# Patient Record
Sex: Female | Born: 1945 | Race: White | Hispanic: No | Marital: Married | Smoking: Former smoker
Health system: Southern US, Community
[De-identification: ages and names within clinical notes are randomized; demographics above are authoritative.]

## PROBLEM LIST (undated history)

## (undated) DIAGNOSIS — G35D Multiple sclerosis, unspecified: Secondary | ICD-10-CM

## (undated) DIAGNOSIS — R7303 Prediabetes: Secondary | ICD-10-CM

## (undated) DIAGNOSIS — G8929 Other chronic pain: Secondary | ICD-10-CM

## (undated) DIAGNOSIS — I10 Essential (primary) hypertension: Secondary | ICD-10-CM

## (undated) DIAGNOSIS — G2581 Restless legs syndrome: Secondary | ICD-10-CM

## (undated) DIAGNOSIS — K219 Gastro-esophageal reflux disease without esophagitis: Secondary | ICD-10-CM

## (undated) DIAGNOSIS — G35 Multiple sclerosis: Secondary | ICD-10-CM

## (undated) HISTORY — PX: EYE SURGERY: SHX253

## (undated) HISTORY — PX: TMJ ARTHROPLASTY: SHX1066

---

## 2015-09-30 DIAGNOSIS — J040 Acute laryngitis: Secondary | ICD-10-CM | POA: Diagnosis not present

## 2015-09-30 DIAGNOSIS — R05 Cough: Secondary | ICD-10-CM | POA: Diagnosis not present

## 2015-09-30 DIAGNOSIS — J40 Bronchitis, not specified as acute or chronic: Secondary | ICD-10-CM | POA: Diagnosis not present

## 2015-09-30 DIAGNOSIS — J209 Acute bronchitis, unspecified: Secondary | ICD-10-CM | POA: Diagnosis not present

## 2017-08-14 DIAGNOSIS — J040 Acute laryngitis: Secondary | ICD-10-CM | POA: Diagnosis not present

## 2017-08-14 DIAGNOSIS — J0100 Acute maxillary sinusitis, unspecified: Secondary | ICD-10-CM | POA: Diagnosis not present

## 2018-03-17 DIAGNOSIS — I87393 Chronic venous hypertension (idiopathic) with other complications of bilateral lower extremity: Secondary | ICD-10-CM | POA: Diagnosis not present

## 2018-03-17 DIAGNOSIS — R600 Localized edema: Secondary | ICD-10-CM | POA: Diagnosis not present

## 2020-09-07 DIAGNOSIS — M47816 Spondylosis without myelopathy or radiculopathy, lumbar region: Secondary | ICD-10-CM | POA: Diagnosis not present

## 2020-09-07 DIAGNOSIS — E785 Hyperlipidemia, unspecified: Secondary | ICD-10-CM | POA: Diagnosis not present

## 2020-09-07 DIAGNOSIS — I249 Acute ischemic heart disease, unspecified: Secondary | ICD-10-CM | POA: Diagnosis not present

## 2020-09-07 DIAGNOSIS — I701 Atherosclerosis of renal artery: Secondary | ICD-10-CM | POA: Diagnosis not present

## 2020-09-07 DIAGNOSIS — I708 Atherosclerosis of other arteries: Secondary | ICD-10-CM | POA: Diagnosis not present

## 2020-09-07 DIAGNOSIS — J439 Emphysema, unspecified: Secondary | ICD-10-CM | POA: Diagnosis not present

## 2020-09-07 DIAGNOSIS — Z87891 Personal history of nicotine dependence: Secondary | ICD-10-CM | POA: Diagnosis not present

## 2020-09-07 DIAGNOSIS — K7689 Other specified diseases of liver: Secondary | ICD-10-CM | POA: Diagnosis not present

## 2020-09-07 DIAGNOSIS — I10 Essential (primary) hypertension: Secondary | ICD-10-CM | POA: Diagnosis not present

## 2020-09-07 DIAGNOSIS — I2510 Atherosclerotic heart disease of native coronary artery without angina pectoris: Secondary | ICD-10-CM | POA: Diagnosis not present

## 2020-09-07 DIAGNOSIS — R0789 Other chest pain: Secondary | ICD-10-CM | POA: Diagnosis not present

## 2020-09-07 DIAGNOSIS — Z7952 Long term (current) use of systemic steroids: Secondary | ICD-10-CM | POA: Diagnosis not present

## 2020-09-07 DIAGNOSIS — R079 Chest pain, unspecified: Secondary | ICD-10-CM | POA: Diagnosis not present

## 2020-09-07 DIAGNOSIS — I214 Non-ST elevation (NSTEMI) myocardial infarction: Secondary | ICD-10-CM | POA: Diagnosis not present

## 2020-09-07 DIAGNOSIS — Z79899 Other long term (current) drug therapy: Secondary | ICD-10-CM | POA: Diagnosis not present

## 2020-09-08 ENCOUNTER — Inpatient Hospital Stay (HOSPITAL_COMMUNITY)
Admission: AD | Admit: 2020-09-08 | Discharge: 2020-09-12 | DRG: 280 | Disposition: A | Discharge status: Home / Self Care | Payer: Medicare Other | Source: Other Acute Inpatient Hospital | Attending: Internal Medicine | Admitting: Internal Medicine

## 2020-09-08 ENCOUNTER — Inpatient Hospital Stay (HOSPITAL_COMMUNITY): Payer: Medicare Other

## 2020-09-08 DIAGNOSIS — R7303 Prediabetes: Secondary | ICD-10-CM | POA: Diagnosis present

## 2020-09-08 DIAGNOSIS — R410 Disorientation, unspecified: Secondary | ICD-10-CM | POA: Diagnosis not present

## 2020-09-08 DIAGNOSIS — I639 Cerebral infarction, unspecified: Secondary | ICD-10-CM | POA: Diagnosis not present

## 2020-09-08 DIAGNOSIS — G35D Multiple sclerosis, unspecified: Secondary | ICD-10-CM | POA: Diagnosis present

## 2020-09-08 DIAGNOSIS — K219 Gastro-esophageal reflux disease without esophagitis: Secondary | ICD-10-CM | POA: Diagnosis not present

## 2020-09-08 DIAGNOSIS — R931 Abnormal findings on diagnostic imaging of heart and coronary circulation: Secondary | ICD-10-CM | POA: Diagnosis not present

## 2020-09-08 DIAGNOSIS — I10 Essential (primary) hypertension: Secondary | ICD-10-CM | POA: Diagnosis not present

## 2020-09-08 DIAGNOSIS — I2510 Atherosclerotic heart disease of native coronary artery without angina pectoris: Secondary | ICD-10-CM | POA: Diagnosis present

## 2020-09-08 DIAGNOSIS — E669 Obesity, unspecified: Secondary | ICD-10-CM | POA: Diagnosis present

## 2020-09-08 DIAGNOSIS — G35 Multiple sclerosis: Secondary | ICD-10-CM | POA: Diagnosis not present

## 2020-09-08 DIAGNOSIS — I708 Atherosclerosis of other arteries: Secondary | ICD-10-CM | POA: Diagnosis not present

## 2020-09-08 DIAGNOSIS — J439 Emphysema, unspecified: Secondary | ICD-10-CM | POA: Diagnosis not present

## 2020-09-08 DIAGNOSIS — I6330 Cerebral infarction due to thrombosis of unspecified cerebral artery: Secondary | ICD-10-CM | POA: Diagnosis not present

## 2020-09-08 DIAGNOSIS — G8929 Other chronic pain: Secondary | ICD-10-CM | POA: Diagnosis present

## 2020-09-08 DIAGNOSIS — D649 Anemia, unspecified: Secondary | ICD-10-CM | POA: Diagnosis present

## 2020-09-08 DIAGNOSIS — G928 Other toxic encephalopathy: Secondary | ICD-10-CM | POA: Diagnosis present

## 2020-09-08 DIAGNOSIS — E785 Hyperlipidemia, unspecified: Secondary | ICD-10-CM | POA: Diagnosis present

## 2020-09-08 DIAGNOSIS — R29705 NIHSS score 5: Secondary | ICD-10-CM | POA: Diagnosis present

## 2020-09-08 DIAGNOSIS — I214 Non-ST elevation (NSTEMI) myocardial infarction: Principal | ICD-10-CM | POA: Diagnosis present

## 2020-09-08 DIAGNOSIS — I701 Atherosclerosis of renal artery: Secondary | ICD-10-CM | POA: Diagnosis not present

## 2020-09-08 DIAGNOSIS — R400 Somnolence: Secondary | ICD-10-CM | POA: Diagnosis not present

## 2020-09-08 DIAGNOSIS — I671 Cerebral aneurysm, nonruptured: Secondary | ICD-10-CM | POA: Diagnosis present

## 2020-09-08 DIAGNOSIS — Z6833 Body mass index (BMI) 33.0-33.9, adult: Secondary | ICD-10-CM

## 2020-09-08 DIAGNOSIS — I249 Acute ischemic heart disease, unspecified: Secondary | ICD-10-CM

## 2020-09-08 DIAGNOSIS — M549 Dorsalgia, unspecified: Secondary | ICD-10-CM | POA: Diagnosis present

## 2020-09-08 DIAGNOSIS — G2581 Restless legs syndrome: Secondary | ICD-10-CM | POA: Diagnosis present

## 2020-09-08 DIAGNOSIS — M47816 Spondylosis without myelopathy or radiculopathy, lumbar region: Secondary | ICD-10-CM | POA: Diagnosis not present

## 2020-09-08 DIAGNOSIS — Z7952 Long term (current) use of systemic steroids: Secondary | ICD-10-CM | POA: Diagnosis not present

## 2020-09-08 DIAGNOSIS — Z79899 Other long term (current) drug therapy: Secondary | ICD-10-CM | POA: Diagnosis not present

## 2020-09-08 DIAGNOSIS — G4710 Hypersomnia, unspecified: Secondary | ICD-10-CM | POA: Diagnosis present

## 2020-09-08 DIAGNOSIS — I63541 Cerebral infarction due to unspecified occlusion or stenosis of right cerebellar artery: Secondary | ICD-10-CM | POA: Diagnosis present

## 2020-09-08 DIAGNOSIS — E119 Type 2 diabetes mellitus without complications: Secondary | ICD-10-CM | POA: Diagnosis present

## 2020-09-08 DIAGNOSIS — Z87891 Personal history of nicotine dependence: Secondary | ICD-10-CM

## 2020-09-08 DIAGNOSIS — K7689 Other specified diseases of liver: Secondary | ICD-10-CM | POA: Diagnosis not present

## 2020-09-08 DIAGNOSIS — I25110 Atherosclerotic heart disease of native coronary artery with unstable angina pectoris: Secondary | ICD-10-CM | POA: Diagnosis not present

## 2020-09-08 DIAGNOSIS — R079 Chest pain, unspecified: Secondary | ICD-10-CM

## 2020-09-08 DIAGNOSIS — I361 Nonrheumatic tricuspid (valve) insufficiency: Secondary | ICD-10-CM

## 2020-09-08 HISTORY — DX: Multiple sclerosis, unspecified: G35.D

## 2020-09-08 HISTORY — DX: Essential (primary) hypertension: I10

## 2020-09-08 HISTORY — DX: Gastro-esophageal reflux disease without esophagitis: K21.9

## 2020-09-08 HISTORY — DX: Other chronic pain: G89.29

## 2020-09-08 HISTORY — DX: Restless legs syndrome: G25.81

## 2020-09-08 HISTORY — DX: Prediabetes: R73.03

## 2020-09-08 HISTORY — DX: Multiple sclerosis: G35

## 2020-09-08 LAB — BRAIN NATRIURETIC PEPTIDE: B Natriuretic Peptide: 43 pg/mL (ref 0.0–100.0)

## 2020-09-08 LAB — BASIC METABOLIC PANEL
Anion gap: 4 — ABNORMAL LOW (ref 5–15)
BUN: 13 mg/dL (ref 8–23)
CO2: 22 mmol/L (ref 22–32)
Calcium: 8.2 mg/dL — ABNORMAL LOW (ref 8.9–10.3)
Chloride: 111 mmol/L (ref 98–111)
Creatinine, Ser: 0.59 mg/dL (ref 0.44–1.00)
GFR, Estimated: >60 mL/min (ref >60)
Glucose, Bld: 138 mg/dL — ABNORMAL HIGH (ref 70–99)
Potassium: 3.9 mmol/L (ref 3.5–5.1)
Sodium: 137 mmol/L (ref 135–145)

## 2020-09-08 LAB — LACTIC ACID, PLASMA: Lactic Acid, Venous: 1.5 mmol/L (ref 0.5–1.9)

## 2020-09-08 LAB — BLOOD GAS, ARTERIAL
Acid-base deficit: 2.4 mmol/L — ABNORMAL HIGH (ref 0.0–2.0)
Bicarbonate: 22.5 mmol/L (ref 20.0–28.0)
Drawn by: 283401
FIO2: 32
O2 Saturation: 94.6 %
Patient temperature: 37
pCO2 arterial: 43.3 mmHg (ref 32.0–48.0)
pH, Arterial: 7.337 — ABNORMAL LOW (ref 7.350–7.450)
pO2, Arterial: 77.9 mmHg — ABNORMAL LOW (ref 83.0–108.0)

## 2020-09-08 LAB — HEPARIN LEVEL (UNFRACTIONATED): Heparin Unfractionated: 0.26 IU/mL — ABNORMAL LOW (ref 0.30–0.70)

## 2020-09-08 LAB — TROPONIN I (HIGH SENSITIVITY): Troponin I (High Sensitivity): 1900 ng/L (ref <18)

## 2020-09-08 MED ORDER — ONDANSETRON HCL 4 MG/2ML IJ SOLN: 4.0000 mg | Freq: Four times a day (QID) | INTRAMUSCULAR | Status: DC | PRN

## 2020-09-08 MED ORDER — ATORVASTATIN CALCIUM 80 MG PO TABS
80.0000 mg | ORAL_TABLET | Freq: Every day | ORAL | Status: DC
  Administered 2020-09-10 – 2020-09-12 (×3): 80 mg via ORAL
  Filled 2020-09-08 (×3): qty 1

## 2020-09-08 MED ORDER — ACETAMINOPHEN 325 MG PO TABS
650.0000 mg | ORAL_TABLET | ORAL | Status: DC | PRN
  Administered 2020-09-09 – 2020-09-12 (×4): 650 mg via ORAL
  Filled 2020-09-08 (×4): qty 2

## 2020-09-08 MED ORDER — NITROGLYCERIN 0.4 MG SL SUBL: 0.4000 mg | SUBLINGUAL_TABLET | SUBLINGUAL | Status: DC | PRN

## 2020-09-08 MED ORDER — NITROGLYCERIN IN D5W 200-5 MCG/ML-% IV SOLN
0.0000 ug/min | INTRAVENOUS | Status: DC
  Administered 2020-09-08: 10 ug/min via INTRAVENOUS
  Filled 2020-09-08: qty 250

## 2020-09-08 MED ORDER — HEPARIN (PORCINE) 25000 UT/250ML-% IV SOLN
1400.0000 [IU]/h | INTRAVENOUS | Status: AC
  Administered 2020-09-08: 900 [IU]/h via INTRAVENOUS
  Administered 2020-09-09 – 2020-09-10 (×2): 1250 [IU]/h via INTRAVENOUS
  Administered 2020-09-11 – 2020-09-12 (×2): 1400 [IU]/h via INTRAVENOUS
  Filled 2020-09-08 (×6): qty 250

## 2020-09-08 MED ORDER — METOPROLOL SUCCINATE ER 25 MG PO TB24
25.0000 mg | ORAL_TABLET | Freq: Every day | ORAL | Status: DC
  Administered 2020-09-09 – 2020-09-11 (×3): 25 mg via ORAL
  Filled 2020-09-08 (×4): qty 1

## 2020-09-08 MED ORDER — ASPIRIN EC 81 MG PO TBEC
81.0000 mg | DELAYED_RELEASE_TABLET | Freq: Every day | ORAL | Status: DC
  Administered 2020-09-10 – 2020-09-12 (×3): 81 mg via ORAL
  Filled 2020-09-08 (×3): qty 1

## 2020-09-08 NOTE — Progress Notes (Signed)
1606 Patient arrived to unit via stretcher with mobile transport.  Patient responds to voice and touch, minially responsive will follow few commands.  Does slur her name and birthday with very delayed response.  Patient denies all pain.  Patient  remains on Nitro drip infusing at and heparin drip at 900units.    [id number] Notified Dr. Shari Prows.  MD will come to bedside to assess.     1630 MD at bedside.  Called former Hospital and spoke with patient's nurse "Corrie Dandy" and inquired about last known normal and any sedating meds she might have received.  Per Nurse patient was declining most of the day.  New orders recevied by MD.  Stat Head CT.

## 2020-09-08 NOTE — Progress Notes (Signed)
ANTICOAGULATION CONSULT NOTE  Pharmacy Consult for heparin Indication: chest pain/ACS  Not on File  Patient Measurements:   Heparin Dosing Weight: 81kg (per OSH records)  Vital Signs: Temp: 97.1 F (36.2 C) (03/12 1606) Temp Source: Axillary (03/12 1606) BP: 149/74 (03/12 1606) Pulse Rate: 72 (03/12 1606)  Labs: No results for input(s): HGB, HCT, PLT, APTT, LABPROT, INR, HEPARINUNFRC, HEPRLOWMOCWT, CREATININE, CKTOTAL, CKMB, TROPONINIHS in the last 72 hours.  CrCl cannot be calculated (No successful lab value found.).   Medical History: No past medical history on file.   Assessment: 33 yoF admitted from OSH, pharmacy asked to manage IV heparin for ACS. No AC PTA noted, CBC wnl at OSH 3/11 pm. Heparin infusing at 900 units/h - started earlier this morning at OSH ~1000 and heparin level is subtherapeutic at 0.26.   Goal of Therapy:  Heparin level 0.3-0.7 units/ml Monitor platelets by anticoagulation protocol: Yes   Plan:  -Increase heparin to 1050 units/h -Recheck heparin level with am labs   Fredonia Highland, PharmD, BCPS, [facility] Clinical Pharmacist [phone] Please check AMION for all [facility] numbers 09/08/2020

## 2020-09-08 NOTE — Progress Notes (Signed)
Checked on patient overnight, more alert than at shift change. Now A&O x3 although drowsy. BG (138), ABG 7.34/43/77. No recent sedating meds. Mental status improving. Significant trop elevation on admission. CP only 1/10 severity on low dose nitro gtt.

## 2020-09-08 NOTE — Progress Notes (Addendum)
Informed Dr. Shari Prows of critical Troponin of 1900.  EKG and CT completed.

## 2020-09-08 NOTE — H&P (Signed)
Cardiology Admission History and Physical:   Patient ID: Caitlin Pierce MRN: [id number]; DOB: Apr 18, 1946   Admission date: 09/08/2020  PCP:  No primary care provider on file.   [facility]  Cardiologist:  No primary care provider on file.  Advanced Practice Provider:  No care team member to display Electrophysiologist:  None     Chief Complaint:  Chest pain  Patient Profile:   Caitlin Pierce is a 75 y.o. female with past medical history of MS, prior tobacco use, and no known cardiac history who initially presented to [facility] with chest pain found to have trop elevation consistent with NSTEMI. [facility] hospital complicated by syncopal episode after using the bathroom prompting more urgent transfer to [facility] hospital.  History of Present Illness:   Caitlin Pierce is a 75 year old female with PMH of MS and no known cardiac history who initially presented to [facility] with chest pain. Specifically, on the night of presentation, the patient was sitting watching TV when she developed severe, squeezing chest pain in the center of her chest with associated SOB. She took aspirin but the pain persisted and therefore EMS was called and she was taken to [facility] for further evaluation. There, ECG without ischemic changes. Trop 0.04-->0.32. CTA negative for PE. Was seen by Cardiology and given presentation and concern for UA, the plan was to transfer to [facility] Monday for cath. Her course there, however, was complicated by an episode of syncope after using the restroom (did not hit her head) prompting more urgent transfer to [facility]. Of note, the patient was alert and oriented throughout her stay at [facility] per report.  On arrival here, the patient is very somnolent and only mildly responsive. Able to follow simple commands and states she is chest pain free with no SOB. Able to squeeze your fingers on command but is mainly sleeping and not responsive. Emergent CT head performed which  was negative. Lactate normal. ECG without acute ischemic changes. Spoke to [employer] who stated she got zofran and phenergan prior to transfer which may have prompted confusion. She remains on heparin gtt and nitro gtt. Trop now 1900. BNP normal at 43. Glucose 138. Cr 0.59 with no significant electrolyte abnormalities.    Medications Prior to Admission: Prior to Admission medications   Medication Sig Start Date End Date Taking? Authorizing Provider  ibuprofen (ADVIL) 200 MG tablet Take 600 mg by mouth every 6 (six) hours as needed for headache (pain).   Yes [provider]  rOPINIRole (REQUIP) 1 MG tablet Take 1-4 mg by mouth See admin instructions. Take one tablet (1 mg) by mouth twice daily as needed for restless legs, take four tablets (4 mg) at bedtime 04/23/20  Yes [provider]     Allergies:   No Known Allergies  Social History:   Social History   Socioeconomic History  . Marital status: Married    Spouse name: Not on file  . Number of children: Not on file  . Years of education: Not on file  . Highest education level: Not on file  Occupational History  . Not on file  Tobacco Use  . Smoking status: Not on file  . Smokeless tobacco: Not on file  Substance and Sexual Activity  . Alcohol use: Not on file  . Drug use: Not on file  . Sexual activity: Not on file  Other Topics Concern  . Not on file  Social History Narrative  .  Not on file   Social Determinants of Health   Financial Resource Strain: Not on file  Food Insecurity: Not on file  Transportation Needs: Not on file  Physical Activity: Not on file  Stress: Not on file  Social Connections: Not on file  Intimate Partner Violence: Not on file    Family History:   The patient's family history is not on file.    ROS:  Denies chest pain or SOB. Otherwise unable to obtain due to somnolence  Physical Exam/Data:   Vitals:   09/08/20 1751 09/08/20 1806 09/08/20 1821 09/08/20  1836  BP: (!) 142/53 124/65 (!) 103/50 (!) 117/55  Pulse: 77 70 71   Resp: 16 20 20    Temp:      TempSrc:      SpO2: 93% 95% 98%    No intake or output data in the 24 hours ending 09/08/20 1848 No flowsheet data found.   There is no height or weight on file to calculate BMI.  General:  Very somnolent on exam; minimally responsive but able to state her name and follow simple commands HEENT: normal Neck: no JVD Vascular: No carotid bruits; FA pulses 2+ bilaterally without bruits  Cardiac:  normal S1, S2; RRR; no murmur  Lungs:  clear to auscultation bilaterally, no wheezing, rhonchi or rales  Abd: soft, nontender, no hepatomegaly  Ext: no edema Musculoskeletal:  No deformities, BUE and BLE strength normal and equal Skin: warm and dry  Neuro:  Minimally responsive, able to follow simple commands when asked Psych:  Unable to assess   EKG:  The ECG that was done  was personally reviewed and demonstrates NSR, poor r-wave progression (similar to [facility] ECG)  Relevant CV Studies: Pending  Laboratory Data:  High Sensitivity Troponin:   Recent Labs  Lab 09/08/20 1706  TROPONINIHS 1,900*      Chemistry Recent Labs  Lab 09/08/20 1706  NA 137  K 3.9  CL 111  CO2 22  GLUCOSE 138*  BUN 13  CREATININE 0.59  CALCIUM 8.2*  GFRNONAA >60  ANIONGAP 4*    No results for input(s): PROT, ALBUMIN, AST, ALT, ALKPHOS, BILITOT in the last 168 hours. HematologyNo results for input(s): WBC, RBC, HGB, HCT, MCV, MCH, MCHC, RDW, PLT in the last 168 hours. BNP Recent Labs  Lab 09/08/20 1706  BNP 43.0    DDimer No results for input(s): DDIMER in the last 168 hours.   Radiology/Studies:  CT HEAD WO CONTRAST  Result Date: 09/08/2020 CLINICAL DATA:  Confusion EXAM: CT HEAD WITHOUT CONTRAST TECHNIQUE: Contiguous axial images were obtained from the base of the skull through the vertex without intravenous contrast. COMPARISON:  None. FINDINGS: Brain: No evidence of acute infarction,  hemorrhage, hydrocephalus, extra-axial collection or mass lesion/mass effect. Mild-moderate low-density changes within the periventricular and subcortical white matter compatible with chronic microvascular ischemic change. Mild diffuse cerebral volume loss. Vascular: Atherosclerotic calcifications involving the large vessels of the skull base. No unexpected hyperdense vessel. Skull: Normal. Negative for fracture or focal lesion. Sinuses/Orbits: No acute finding. Other: None. IMPRESSION: 1. No acute intracranial findings. 2. 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 Electronically Signed   By: 11/08/2020 D.O.   On: 09/08/2020 18:00     Assessment and Plan:   #NSTEMI: Patient with no known cardiac history presenting with severe substernal chest pressure that began at rest with troponin rise consistent with NSTEMI. History limited as patient currently minimally responsive, however, she denies any current chest  pain or SOB. ECG with poor r-wave progression but no acute ischemic changes. Trop on arrival 1900. BNP normal. TTE pending. CT head negative for acute pathology. -Continue heparin gtt -Continue ASA 81mg  daily and atorvastatin 80mg  once more awake and able to take medications -Start metop 25mg  XL daily once able to tolerate PO -ACE/ARB after cath -Trend trop -TTE pending -Continue nitro gtt for chest pain -Plan for cath Monday pending clinically stability. Will need to work-up AMS more extensively  #AMS: Unclear etiology. Possibly medication effect with zofran and phenergan received at OSH. No opioids given. Also with MS. CT head negative for acute pathology. Not febrile. Lactate normal. Glucose normal. Electrolytes wnl. Currently chest pain free with no ECG changes. -No sedating medications overnight -CT head negative -Check blood and urine cultures -Lactate normal -Manage NSTEMI as above -Neuro consult in AM  #Syncope: Possibly vagal in the setting of using  the restroom. No reported arrhythmias or hypotension. Fortunately did not fall or hit her head but made it to the bed. Story limited as patient altered and husband was not present during the event. Now altered as above. CT head negative. HD stable. -Manage NSTEMI and AMS as above -Telemetry -Once patient more alert, will obtain history regarding syncope  #MS: -Given AMS, may consider neuro consult in AM    Risk Assessment/Risk Scores:     TIMI Risk Score for Unstable Angina or Non-ST Elevation MI:   The patient's TIMI risk score is 4, which indicates a 20% risk of all cause mortality, new or recurrent myocardial infarction or need for urgent revascularization in the next 14 days.{   Severity of Illness: The appropriate patient status for this patient is INPATIENT. Inpatient status is judged to be reasonable and necessary in order to provide the required intensity of service to ensure the patient's safety. The patient's presenting symptoms, physical exam findings, and initial radiographic and laboratory data in the context of their chronic comorbidities is felt to place them at high risk for further clinical deterioration. Furthermore, it is not anticipated that the patient will be medically stable for discharge from the hospital within 2 midnights of admission. The following factors support the patient status of inpatient.   " The patient's presenting symptoms include NSTEMI, AMS. " The worrisome physical exam findings include Trop elevation. " The initial radiographic and laboratory data are worrisome because of Trop 1900. " The chronic co-morbidities include MS.   * I certify that at the point of admission it is my clinical judgment that the patient will require inpatient hospital care spanning beyond 2 midnights from the point of admission due to high intensity of service, high risk for further deterioration and high frequency of surveillance required.*    For questions or updates,  please contact [facility] Please consult [email or web address] for contact info under     Signed, , MD  09/08/2020 6:48 PM

## 2020-09-08 NOTE — Progress Notes (Signed)
Patient back to room from CT, RE: stat head CT.

## 2020-09-09 ENCOUNTER — Encounter (HOSPITAL_COMMUNITY): Payer: Self-pay | Admitting: Cardiology

## 2020-09-09 ENCOUNTER — Inpatient Hospital Stay (HOSPITAL_COMMUNITY): Payer: Medicare Other

## 2020-09-09 DIAGNOSIS — I214 Non-ST elevation (NSTEMI) myocardial infarction: Principal | ICD-10-CM

## 2020-09-09 DIAGNOSIS — R400 Somnolence: Secondary | ICD-10-CM

## 2020-09-09 DIAGNOSIS — K219 Gastro-esophageal reflux disease without esophagitis: Secondary | ICD-10-CM | POA: Diagnosis present

## 2020-09-09 DIAGNOSIS — R7303 Prediabetes: Secondary | ICD-10-CM | POA: Diagnosis present

## 2020-09-09 DIAGNOSIS — G35 Multiple sclerosis: Secondary | ICD-10-CM | POA: Diagnosis present

## 2020-09-09 DIAGNOSIS — M549 Dorsalgia, unspecified: Secondary | ICD-10-CM | POA: Diagnosis present

## 2020-09-09 DIAGNOSIS — G2581 Restless legs syndrome: Secondary | ICD-10-CM | POA: Diagnosis present

## 2020-09-09 DIAGNOSIS — G8929 Other chronic pain: Secondary | ICD-10-CM | POA: Diagnosis present

## 2020-09-09 DIAGNOSIS — I10 Essential (primary) hypertension: Secondary | ICD-10-CM | POA: Diagnosis present

## 2020-09-09 LAB — BASIC METABOLIC PANEL
Anion gap: 5 (ref 5–15)
BUN: 10 mg/dL (ref 8–23)
CO2: 24 mmol/L (ref 22–32)
Calcium: 8.3 mg/dL — ABNORMAL LOW (ref 8.9–10.3)
Chloride: 108 mmol/L (ref 98–111)
Creatinine, Ser: 0.51 mg/dL (ref 0.44–1.00)
GFR, Estimated: >60 mL/min (ref >60)
Glucose, Bld: 123 mg/dL — ABNORMAL HIGH (ref 70–99)
Potassium: 3.8 mmol/L (ref 3.5–5.1)
Sodium: 137 mmol/L (ref 135–145)

## 2020-09-09 LAB — ECHOCARDIOGRAM COMPLETE
AR max vel: 3.11 cm2
AV Area VTI: 3.05 cm2
AV Area mean vel: 3.18 cm2
AV Mean grad: 4 mmHg
AV Peak grad: 8.6 mmHg
Ao pk vel: 1.47 m/s
Area-P 1/2: 4.71 cm2
Calc EF: 64.4 %
Height: 64 in
S' Lateral: 2.9 cm
Single Plane A2C EF: 61.6 %
Single Plane A4C EF: 69 %
Weight: 3153.46 oz

## 2020-09-09 LAB — CBC
HCT: 30.2 % — ABNORMAL LOW (ref 36.0–46.0)
Hemoglobin: 10.5 g/dL — ABNORMAL LOW (ref 12.0–15.0)
MCH: 33.8 pg (ref 26.0–34.0)
MCHC: 34.8 g/dL (ref 30.0–36.0)
MCV: 97.1 fL (ref 80.0–100.0)
Platelets: 214 10*3/uL (ref 150–400)
RBC: 3.11 MIL/uL — ABNORMAL LOW (ref 3.87–5.11)
RDW: 18.6 % — ABNORMAL HIGH (ref 11.5–15.5)
WBC: 8 10*3/uL (ref 4.0–10.5)
nRBC: 0.3 % — ABNORMAL HIGH (ref 0.0–0.2)

## 2020-09-09 LAB — HEPARIN LEVEL (UNFRACTIONATED)
Heparin Unfractionated: 0.23 IU/mL — ABNORMAL LOW (ref 0.30–0.70)
Heparin Unfractionated: 0.41 IU/mL (ref 0.30–0.70)

## 2020-09-09 LAB — TROPONIN I (HIGH SENSITIVITY): Troponin I (High Sensitivity): 609 ng/L (ref <18)

## 2020-09-09 LAB — MAGNESIUM: Magnesium: 1.8 mg/dL (ref 1.7–2.4)

## 2020-09-09 NOTE — Progress Notes (Addendum)
Progress Note  Patient Name: Caitlin Pierce Date of Encounter: 09/09/2020  [facility] Cardiologist: No primary care provider on file.   Subjective   More awake but remains somnolent and falls asleep on examination. Denies chest pain or SOB.  ABG overnight 7.34/43/77 CT head negative for acute pathology Infectious work-up sent Trop 1900 TTE pending  Inpatient Medications    Scheduled Meds: . aspirin EC  81 mg Oral Daily  . atorvastatin  80 mg Oral Daily  . metoprolol succinate  25 mg Oral Daily   Continuous Infusions: . heparin 1,250 Units/hr (09/09/20 0750)  . nitroGLYCERIN 15 mcg/min (09/09/20 0740)   PRN Meds: acetaminophen, ondansetron (ZOFRAN) IV   Vital Signs    Vitals:   09/09/20 0507 09/09/20 0551 09/09/20 0847 09/09/20 0939  BP: (!) 133/50 (!) 139/52 (!) 131/55 136/62  Pulse:  76 63 65  Resp: 20  20   Temp: 98 F (36.7 C)  97.8 F (36.6 C)   TempSrc: Axillary  Oral   SpO2:  98% 97%   Weight: 89.4 kg     Height:        Intake/Output Summary (Last 24 hours) at 09/09/2020 1212 Last data filed at 09/09/2020 0600 Gross per 24 hour  Intake 140.42 ml  Output 350 ml  Net -209.58 ml   Last 3 Weights 09/09/2020 09/08/2020  Weight (lbs) 197 lb 1.5 oz 197 lb 1.5 oz  Weight (kg) 89.4 kg 89.4 kg      Telemetry    NSR - Personally Reviewed  ECG    NSR, non-specific ST-T wave changes - Personally Reviewed  Physical Exam   GEN: Somnolent but arousable. Answers questions appropriately but then falls back asleep Neck: No JVD Cardiac: RRR, no murmurs, rubs, or gallops.  Respiratory: Clear to auscultation bilaterally. GI: Soft, nontender, non-distended  MS: No edema; No deformity. Neuro:  Very somnolent but AAOx3, moves all extremities Psych: Somnolent  Labs    High Sensitivity Troponin:   Recent Labs  Lab 09/08/20 1706  TROPONINIHS 1,900*      Chemistry Recent Labs  Lab 09/08/20 1706 09/09/20 0451  NA 137 137  K 3.9 3.8  CL 111 108   CO2 22 24  GLUCOSE 138* 123*  BUN 13 10  CREATININE 0.59 0.51  CALCIUM 8.2* 8.3*  GFRNONAA >60 >60  ANIONGAP 4* 5     Hematology Recent Labs  Lab 09/09/20 0451  WBC 8.0  RBC 3.11*  HGB 10.5*  HCT 30.2*  MCV 97.1  MCH 33.8  MCHC 34.8  RDW 18.6*  PLT 214    BNP Recent Labs  Lab 09/08/20 1706  BNP 43.0     DDimer No results for input(s): DDIMER in the last 168 hours.   Radiology    CT HEAD WO CONTRAST  Result Date: 09/08/2020 CLINICAL DATA:  Confusion EXAM: CT HEAD WITHOUT CONTRAST TECHNIQUE: Contiguous axial images were obtained from the base of the skull through the vertex without intravenous contrast. COMPARISON:  None. FINDINGS: Brain: No evidence of acute infarction, hemorrhage, hydrocephalus, extra-axial collection or mass lesion/mass effect. Mild-moderate low-density changes within the periventricular and subcortical white matter compatible with chronic microvascular ischemic change. Mild diffuse cerebral volume loss. Vascular: Atherosclerotic calcifications involving the large vessels of the skull base. No unexpected hyperdense vessel. Skull: Normal. Negative for fracture or focal lesion. Sinuses/Orbits: No acute finding. Other: None. IMPRESSION: 1. No acute intracranial findings. 2. 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 Electronically Signed   By:  Nicholas  Plundo D.O.   On: 09/08/2020 18:00    Cardiac Studies   TTE pending  Patient Profile     75 y.o. female with past medical history of MS, prior tobacco use, and no known cardiac history who initially presented to [facility] with chest pain found to have trop elevation consistent with NSTEMI. [facility] hospital complicated by syncopal episode after using the bathroom prompting more urgent transfer to [facility] hospital. Course her complicated by AMS/significant somnolence likely due to medication effect now improving.  Assessment & Plan    #NSTEMI: Patient with no known cardiac  history presenting with severe substernal chest pressure that began at rest with troponin rise consistent with NSTEMI. History limited as patient currently remains very somnlent, however, she denies any current chest pain or SOB. ECG with poor r-wave progression but no acute ischemic changes. Trop on arrival 1900; repeat pending. BNP normal. TTE pending. CT head negative for acute pathology. -Continue heparin gtt -Continue ASA 81mg  daily and atorvastatin 80mg   -Continue metop 25mg  XL daily  -ACE/ARB after cath -Trend trop -TTE pending -Continue nitro gtt for chest pain -Possible cath Monday, however, would want mental status to improve back to baseline prior to procedure  #AMS: Patient very somnolent and minimally arousable on arrival. Was able to answer brief "yes" and "no" questions and follow simple commands prior to falling back asleep. Improved this morning, however, remains very sedated. CT head negative. ABG wnl. Glucose normal. ECG with no acute ischemic changes. Lactate normal. Infectious work-up pending. Patient is HD stable. -Consult triad hospitalists -Hold sedating medications -CT head negative, ABG wnl -Follow-up blood and urine cultures -Ideally would like to proceed with coronary angiography tomorrow; but should be back to baseline mental status before proceeding  #Syncope: Possibly vagal in the setting of using the restroom. No reported arrhythmias or hypotension. Fortunately did not fall or hit her head but made it to the bed. CT head negative. HD stable. -Manage NSTEMI and AMS as above -Telemetry  #MS: -Given persistent AMS, will consult triad hospitalist   For questions or updates, please contact [facility] Please consult [email or web address] for contact info under        Signed, , MD  09/09/2020, 12:12 PM

## 2020-09-09 NOTE — Progress Notes (Signed)
Pt completely awake alert and oriented this pm. Emelda Brothers RN

## 2020-09-09 NOTE — Consult Note (Signed)
Medical Consultation   Caitlin Pierce  TOI:[id number]  DOB: [birth year]  DOA: 09/08/2020  [facility]  Outpatient Specialists: Beverely Pace - neurosurgery   NOK: Shonda, [place], [phone]  Requesting physician: Shari Prows - cardiology  Reason for consultation: H/o MS but walks with a cane without cognitive impairment at baseline.  Went to [facility] for CP.  Had (near?) syncope there.  Alert and oriented at [facility] but arrived here obtunded.  Meds not suspected as cause.  Head CT and labs unremarkable.  Still very somnolent today.  Needs cath tomorrow but too sedated.   History of Present Illness: Caitlin Pierce is an 75 y.o. female with h/o MS; GERD; HTN; chronic back pain; prediabetes; and RLS who presented on 3/12 to OSH with chest pain.  While at OSH, she was found to have elevated troponin and was transferred to [facility] for cardiac catheterization.  However, she also suffered a near syncope vs. Syncopal event.  She did not appear to have head trauma and was reportedly conversant and appropriate prior to transfer.  Upon arrival at [facility], she was minimally responsive.  Charting indicates that the "patient was declining most of the day" at the OSH.  Overnight she was more alert but A&O x 3 according to notes.  At the time of my evaluation, the patient was exceedingly somnolent.  She was oriented to person and knew that she was in the hospital in [place] but was not able to stay awake long enough to answer further questions.  She has not been prescribed any controlled medications according to the PDMP.  I called and spoke with her husband.  He reports that Friday night about 930pm she started feeling light-headed.  He went to check on her and she was gasping for breath and holding her L chest and shoulder.  EMS came and placed her on a monitor and took her to [facility].  RH decided to keep her overnight.  Yesterday AM (3/12), cardiology did successive EKGs and he reported initial EKG was normal;  2nd was slightly abnormal; and 3rd was abnormal.  They decided to transfer here for cath on Monday.  She went to the bathroom and came back and passed out in the bed - and so they expedited transfer.  She is not sleepy at baseline.  "They doped her up for transport" - she was out of it because of this reason.  This AM, she was able to provide history remembering going to the bathroom but nothing else.  He spoke with her this AM and she was still groggy but much better than prior.  She was still light-headed.  She was diagnosed with MS years ago.  Early in their 48-year marriage, she had headaches and issues and went to a number of doctors with lots of inconclusive diagnoses.  She went to [facility] and was diagnosed with severe TMJ; this was repaired and her headaches resolved.  She had good vision until about 6 months after getting out of [employer].  This got so bad that one eye couldn't be corrected with glasses.  She started having difficulty walking with pain in her hip and trouble with her balance.  She had an MRI of her head and diagnosed MS - probably in the late 90s.  She tried every MS medication at first but they made her sicker than the MS did and so she stopped taking them.  Her last major  episode was in the early 2000s and she started using a quad cane (which she has had ever since).  Her episode made her unable to walk, "like someone hit her in the back with a cattle prod."  She has had some minor episodes since then but none major since.  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   Review of Systems:  ROS Unable to perform due to patient's somnolence   Past Medical History: Past Medical History:  Diagnosis Date  . Chronic back pain   . Essential hypertension   . GERD (gastroesophageal reflux disease)   . MS (multiple sclerosis) (HCC)   . Prediabetes   . RLS (restless legs syndrome)     Past Surgical  History: History reviewed. No pertinent surgical history.   Allergies:  No Known Allergies   Social History:  reports that she has quit smoking. She has never used smokeless tobacco. No history on file for alcohol use and drug use.   Family History: History reviewed. No pertinent family history.    Physical Exam: Vitals:   09/09/20 0551 09/09/20 0847 09/09/20 0939 09/09/20 1245  BP: (!) 139/52 (!) 131/55 136/62 (!) 119/55  Pulse: 76 63 65 73  Resp:  20  16  Temp:  97.8 F (36.6 C)  98.8 F (37.1 C)  TempSrc:  Oral  Oral  SpO2: 98% 97%  98%  Weight:      Height:        Constitutional: very somnolent, oriented x 1, immediately falls asleep after every question Eyes: EOMI, irises appear normal, anicteric sclera,  ENMT: external ears and nose appear normal, normal hearing, Lips appear normal Neck: neck appears normal, no masses CVS: S1-S2 clear, no murmur rubs or gallops, no LE edema, normal pedal pulses  Respiratory:  clear to auscultation bilaterally, no wheezing, rales or rhonchi. Respiratory effort normal. No accessory muscle use.  Abdomen: soft nontender, nondistended Musculoskeletal: : no cyanosis, clubbing or edema noted bilaterally Neuro: too somnolent to assess Psych: very somnolent Skin: no rashes or lesions or ulcers, no induration or nodules    Data reviewed:  I have personally reviewed the recent labs and imaging studies  Pertinent Labs:   Glucose 123 WBC 8.0 Hgb 10.5   Inpatient Medications:   Scheduled Meds: . aspirin EC  81 mg Oral Daily  . atorvastatin  80 mg Oral Daily  . metoprolol succinate  25 mg Oral Daily   Continuous Infusions: . heparin 1,250 Units/hr (09/09/20 0750)  . nitroGLYCERIN 15 mcg/min (09/09/20 0740)     Radiological Exams on Admission: CT HEAD WO CONTRAST  Result Date: 09/08/2020 CLINICAL DATA:  Confusion EXAM: CT HEAD WITHOUT CONTRAST TECHNIQUE: Contiguous axial images were obtained from the base of the skull  through the vertex without intravenous contrast. COMPARISON:  None. FINDINGS: Brain: No evidence of acute infarction, hemorrhage, hydrocephalus, extra-axial collection or mass lesion/mass effect. Mild-moderate low-density changes within the periventricular and subcortical white matter compatible with chronic microvascular ischemic change. Mild diffuse cerebral volume loss. Vascular: Atherosclerotic calcifications involving the large vessels of the skull base. No unexpected hyperdense vessel. Skull: Normal. Negative for fracture or focal lesion. Sinuses/Orbits: No acute finding. Other: None. IMPRESSION: 1. No acute intracranial findings. 2. 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 Electronically Signed   By: Duanne Guess D.O.   On: 09/08/2020 18:00   ECHOCARDIOGRAM COMPLETE  Result Date: 09/09/2020    ECHOCARDIOGRAM  REPORT   Patient Name:   KATHYE CIPRIANI Date of Exam: 09/09/2020 Medical Rec #:  [id number]   Height:       64.0 in Accession #:    [id number]  Weight:       197.1 lb Date of Birth:  11/27/1945   BSA:          1.944 m Patient Age:    74 years    BP:           111/56 mmHg Patient Gender: F           HR:           71 bpm. Exam Location:  Inpatient Procedure: 2D Echo, Cardiac Doppler and Color Doppler Indications:    NSTEMI  History:        Patient has no prior history of Echocardiogram examinations.                 Acute MI.  Sonographer:    Neomia Dear RDCS Referring Phys: [id number] HEATHER E [place] IMPRESSIONS  1. Left ventricular ejection fraction, by estimation, is 65 to 70%. The left ventricle has normal function. The left ventricle has no regional wall motion abnormalities. Left ventricular diastolic parameters are consistent with Grade I diastolic dysfunction (impaired relaxation).  2. Right ventricular systolic function is normal. The right ventricular size is normal. There is normal pulmonary artery systolic pressure.  3. Left atrial size was mildly dilated.  4. The  mitral valve is normal in structure. No evidence of mitral valve regurgitation. No evidence of mitral stenosis.  5. The aortic valve is tricuspid. Aortic valve regurgitation is not visualized. No aortic stenosis is present.  6. The inferior vena cava is normal in size with greater than 50% respiratory variability, suggesting right atrial pressure of 3 mmHg. FINDINGS  Left Ventricle: Left ventricular ejection fraction, by estimation, is 65 to 70%. The left ventricle has normal function. The left ventricle has no regional wall motion abnormalities. The left ventricular internal cavity size was normal in size. There is  no left ventricular hypertrophy. Left ventricular diastolic parameters are consistent with Grade I diastolic dysfunction (impaired relaxation). Indeterminate filling pressures. Right Ventricle: The right ventricular size is normal. No increase in right ventricular wall thickness. Right ventricular systolic function is normal. There is normal pulmonary artery systolic pressure. The tricuspid regurgitant velocity is 2.43 m/s, and  with an assumed right atrial pressure of 3 mmHg, the estimated right ventricular systolic pressure is 26.6 mmHg. Left Atrium: Left atrial size was mildly dilated. Right Atrium: Right atrial size was normal in size. Pericardium: Trivial pericardial effusion is present. Mitral Valve: The mitral valve is normal in structure. No evidence of mitral valve regurgitation. No evidence of mitral valve stenosis. Tricuspid Valve: The tricuspid valve is normal in structure. Tricuspid valve regurgitation is mild . No evidence of tricuspid stenosis. Aortic Valve: The aortic valve is tricuspid. Aortic valve regurgitation is not visualized. No aortic stenosis is present. Aortic valve mean gradient measures 4.0 mmHg. Aortic valve peak gradient measures 8.6 mmHg. Aortic valve area, by VTI measures 3.05 cm. Pulmonic Valve: The pulmonic valve was normal in structure. Pulmonic valve regurgitation is  trivial. No evidence of pulmonic stenosis. Aorta: The aortic root is normal in size and structure. Venous: The inferior vena cava is normal in size with greater than 50% respiratory variability, suggesting right atrial pressure of 3 mmHg. IAS/Shunts: No atrial level shunt detected by color flow Doppler.  LEFT VENTRICLE PLAX 2D LVIDd:  4.60 cm     Diastology LVIDs:         2.90 cm     LV e' medial:    6.42 cm/s LV PW:         1.10 cm     LV E/e' medial:  12.3 LV IVS:        0.95 cm     LV e' lateral:   10.70 cm/s LVOT diam:     2.30 cm     LV E/e' lateral: 7.4 LV SV:         91 LV SV Index:   47 LVOT Area:     4.15 cm  LV Volumes (MOD) LV vol d, MOD A2C: 78.3 ml LV vol d, MOD A4C: 71.5 ml LV vol s, MOD A2C: 30.1 ml LV vol s, MOD A4C: 22.2 ml LV SV MOD A2C:     48.2 ml LV SV MOD A4C:     71.5 ml LV SV MOD BP:      48.5 ml LEFT ATRIUM             Index       RIGHT ATRIUM           Index LA diam:        3.70 cm 1.90 cm/m  RA Area:     16.80 cm LA Vol (A2C):   79.6 ml 40.94 ml/m RA Volume:   44.10 ml  22.68 ml/m LA Vol (A4C):   72.0 ml 37.03 ml/m LA Biplane Vol: 80.1 ml 41.20 ml/m  AORTIC VALVE                   PULMONIC VALVE AV Area (Vmax):    3.11 cm    PV Vmax:       1.09 m/s AV Area (Vmean):   3.18 cm    PV Vmean:      73.300 cm/s AV Area (VTI):     3.05 cm    PV VTI:        0.228 m AV Vmax:           147.00 cm/s PV Peak grad:  4.8 mmHg AV Vmean:          93.700 cm/s PV Mean grad:  3.0 mmHg AV VTI:            0.297 m AV Peak Grad:      8.6 mmHg AV Mean Grad:      4.0 mmHg LVOT Vmax:         110.00 cm/s LVOT Vmean:        71.800 cm/s LVOT VTI:          0.218 m LVOT/AV VTI ratio: 0.73  AORTA Ao Root diam: 3.30 cm Ao Asc diam:  3.10 cm MITRAL VALVE               TRICUSPID VALVE MV Area (PHT): 4.71 cm    TR Peak grad:   23.6 mmHg MV Decel Time: 161 msec    TR Vmax:        243.00 cm/s MV E velocity: 78.80 cm/s MV A velocity: 95.50 cm/s  SHUNTS MV E/A ratio:  0.83        Systemic VTI:  0.22 m                             Systemic Diam: 2.30 cm Chilton Siiffany [place] MD Electronically signed by Elmarie Shileyiffany  Duke Salvia MD Signature Date/Time: 09/09/2020/12:44:47 PM    Final     Impression/Recommendations Active Problems:   NSTEMI (non-ST elevated myocardial infarction) (HCC)   MS (multiple sclerosis) (HCC)   GERD (gastroesophageal reflux disease)   Essential hypertension   Chronic back pain   Prediabetes   RLS (restless legs syndrome)  NSTEMI -Patient presented to [facility] with CP -Elevated troponin, c/w NSTEMI -Transferred to [facility] for cardiac cath, which is planned for tomorrow -Management per cardiology  Acute metabolic encephalopthy -History is not entirely clear but she reportedly had a syncopal episode while at [facility] -Her husband reports excessive sedation prior to transport - although the nurse at [facility] denied this -She appears to have hypersomnolence -This could be related to medications with just very slow metabolism of the drugs -However, she does have a h/o untreated MS and also a reported brain aneurysm and so MRI needs to be performed - this has been requested -Evaluation thus far unremarkable including head CT -According to her nurse, her mental status is completely clear now - so perhaps it was associated with the medications she received prior to transport yesterday  MS -Her husband reports that she had side effects will "all" of the MS drugs after her diagnosis in the last 90s and so she has not been on medications -He does remember her having an MRI at some point with a finding of an aneurysm but he is not sure when that was -Will check MRI -If abnormal, may need neuro vs. Neurosurgical consultation  HTN -She does not appear to be taking medications for this issue at this time  Pre-DM -Will check A1c tomorrow AM  Chronic back pain/RLS -She only takes Advil and Requip at home -Both are on hold for now    Thank you for this consultation.  Our [facility] hospitalist team will follow the patient  with you.   Time Spent: 55 minutes  Jonah Blue M.D. [facility] 09/09/2020, 2:03 PM

## 2020-09-09 NOTE — Progress Notes (Addendum)
ANTICOAGULATION CONSULT NOTE  Pharmacy Consult for heparin Indication: chest pain/ACS  No Known Allergies  Patient Measurements: Height: 5\' 4"  (162.6 cm) Weight: 89.4 kg (197 lb 1.5 oz) IBW/kg (Calculated) : 54.7 Heparin Dosing Weight: 81kg (per OSH records)  Vital Signs: Temp: 98 F (36.7 C) (03/13 0507) Temp Source: Axillary (03/13 0507) BP: 139/52 (03/13 0551) Pulse Rate: 76 (03/13 0551)  Labs: Recent Labs    09/08/20 1706 09/08/20 1821 09/09/20 0451  HGB  --   --  10.5*  HCT  --   --  30.2*  PLT  --   --  214  HEPARINUNFRC  --  0.26* 0.23*  CREATININE 0.59  --  0.51  TROPONINIHS 1,900*  --   --     Estimated Creatinine Clearance: 68.8 mL/min (by C-G formula based on SCr of 0.51 mg/dL).  Medical History: No past medical history on file.  Assessment: 18 yoF admitted from OSH, pharmacy asked to manage IV heparin for ACS. No AC PTA noted, CBC wnl at OSH 3/11 pm. Cardiology planning for cath on Monday.  Heparin level is subtherapeutic despite rate increase from 900 to 1050 units/hr. No interruptions or issues with heparin infusion per RN. Hgb 10.5, platelets 214. No s/sx bleeding reported.   Goal of Therapy:  Heparin level 0.3-0.7 units/ml Monitor platelets by anticoagulation protocol: Yes   Plan:  Increase heparin to 1250 units/h Heparin level in 6 hours Monitor daily heparin level, CBC, s/sx bleeding  Wednesday, PharmD PGY-1 Pharmacy Resident 09/09/2020 7:07 AM Please see AMION for all pharmacy numbers  ADDENDUM: Heparin level is therapeutic at 0.41 at 1250 units/hr.   Plan: Continue heparin 1250 units/hr Monitor daily heparin level, CBC, s/sx bleeding  09/11/2020, PharmD PGY-1 Pharmacy Resident 09/09/2020 2:44 PM

## 2020-09-10 ENCOUNTER — Inpatient Hospital Stay (HOSPITAL_COMMUNITY): Payer: Medicare Other

## 2020-09-10 ENCOUNTER — Other Ambulatory Visit: Payer: Self-pay

## 2020-09-10 DIAGNOSIS — I10 Essential (primary) hypertension: Secondary | ICD-10-CM

## 2020-09-10 DIAGNOSIS — I6330 Cerebral infarction due to thrombosis of unspecified cerebral artery: Secondary | ICD-10-CM

## 2020-09-10 DIAGNOSIS — R7303 Prediabetes: Secondary | ICD-10-CM | POA: Diagnosis not present

## 2020-09-10 DIAGNOSIS — G35 Multiple sclerosis: Secondary | ICD-10-CM | POA: Diagnosis not present

## 2020-09-10 DIAGNOSIS — I214 Non-ST elevation (NSTEMI) myocardial infarction: Secondary | ICD-10-CM | POA: Diagnosis not present

## 2020-09-10 LAB — LIPID PANEL
Cholesterol: 133 mg/dL (ref 0–200)
HDL: 57 mg/dL (ref >40)
LDL Cholesterol: 56 mg/dL (ref 0–99)
Total CHOL/HDL Ratio: 2.3 RATIO
Triglycerides: 99 mg/dL (ref <150)
VLDL: 20 mg/dL (ref 0–40)

## 2020-09-10 LAB — BASIC METABOLIC PANEL
Anion gap: 6 (ref 5–15)
BUN: 11 mg/dL (ref 8–23)
CO2: 27 mmol/L (ref 22–32)
Calcium: 8.4 mg/dL — ABNORMAL LOW (ref 8.9–10.3)
Chloride: 105 mmol/L (ref 98–111)
Creatinine, Ser: 0.55 mg/dL (ref 0.44–1.00)
GFR, Estimated: >60 mL/min (ref >60)
Glucose, Bld: 102 mg/dL — ABNORMAL HIGH (ref 70–99)
Potassium: 3.5 mmol/L (ref 3.5–5.1)
Sodium: 138 mmol/L (ref 135–145)

## 2020-09-10 LAB — CBC
HCT: 31.6 % — ABNORMAL LOW (ref 36.0–46.0)
Hemoglobin: 10.8 g/dL — ABNORMAL LOW (ref 12.0–15.0)
MCH: 33.2 pg (ref 26.0–34.0)
MCHC: 34.2 g/dL (ref 30.0–36.0)
MCV: 97.2 fL (ref 80.0–100.0)
Platelets: 213 10*3/uL (ref 150–400)
RBC: 3.25 MIL/uL — ABNORMAL LOW (ref 3.87–5.11)
RDW: 18.4 % — ABNORMAL HIGH (ref 11.5–15.5)
WBC: 6.3 10*3/uL (ref 4.0–10.5)
nRBC: 0.3 % — ABNORMAL HIGH (ref 0.0–0.2)

## 2020-09-10 LAB — HEPARIN LEVEL (UNFRACTIONATED): Heparin Unfractionated: 0.35 IU/mL (ref 0.30–0.70)

## 2020-09-10 LAB — MAGNESIUM: Magnesium: 1.9 mg/dL (ref 1.7–2.4)

## 2020-09-10 LAB — URINE CULTURE

## 2020-09-10 LAB — HEMOGLOBIN A1C
Hgb A1c MFr Bld: 5.9 % — ABNORMAL HIGH (ref 4.8–5.6)
Mean Plasma Glucose: 122.63 mg/dL

## 2020-09-10 MED ORDER — STROKE: EARLY STAGES OF RECOVERY BOOK
Freq: Once | Status: AC
  Filled 2020-09-10: qty 1

## 2020-09-10 MED ORDER — GADOBUTROL 1 MMOL/ML IV SOLN
9.0000 mL | Freq: Once | INTRAVENOUS | Status: AC | PRN
  Administered 2020-09-10: 9 mL via INTRAVENOUS

## 2020-09-10 NOTE — Progress Notes (Signed)
HOSPITAL MEDICINE OVERNIGHT EVENT NOTE    Notified by nursing of abnormal MRI brain result.  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  MRI brain was originally ordered due to notable change in mentation as well as episode of syncope on the setting of reported history of remotely diagnosed multiple sclerosis.  I called Dr. Otelia Limes with neurology and discussed the MRI results with him.  Treatment plan is complicated by the fact the patient is suffering from concurrent NSTEMI and acute stroke at the same time.  Due to need for heparin infusion to manage NSTEMI, patient is at risk of hemorrhagic conversion however benefits likely outweigh the risks of ongoing anticoagulation.  Dr. Otelia Limes has recommended repeat noncontrast CT brain at approximately 2 AM on 3/15 to evaluate for any evidence of developing hemorrhagic conversion.  He is also recommending proceeding with MRA of the brain without contrast and carotid ultrasounds.  He will evaluate the patient this morning in consultation.  I have additionally called and discussed the case with Dr. Early Osmond, covering for [facility] cardiology.  I have informed him of the results of the MRI as well as breathing him on my discussion with Dr. Otelia Limes.  He will communicate these findings to the provider that was intending to perform the cardiac cath this morning.  For now, I am additionally placing orders for serial neurologic checks, PT evaluation, OT evaluation, SLP evaluation.  Marinda Elk  MD [facility]

## 2020-09-10 NOTE — CV Procedure (Signed)
Carotid duplex completed.  Results can be found under chart review under CV PROC. 09/10/2020 3:17 PM Jody RVT, RDMS

## 2020-09-10 NOTE — Progress Notes (Signed)
SLP Cancellation Note  Patient Details Name: Caitlin Pierce MRN: [id number] DOB: 07/14/1945   Cancelled treatment:       Reason Eval/Treat Not Completed: Other (comment). Pt passed [facility] swallow screen with RN, diet held due to pending procedure. Will plan to f/u for SLE given potential procedure.    Jerin Franzel, Riley Nearing 09/10/2020, 9:00 AM

## 2020-09-10 NOTE — Progress Notes (Signed)
Dr. 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 He had called neuro who will see patient soon and recommended repeat CT head wo in 24h along with MRI brain wo.   Patient on ASA/heparin gtt for NSTEMI management and was planned for cors angio and possible PCI today.   [name] is A&O x4 today, dramatically improved mental status from yesterday evening. She has hx of MS for which the MRI w/wo was initially ordered. She has 0/5 strength of her RLE with minimal sensation which has been that way for years and was attributed to her MS. She uses a quad cane on that side and drags her RLE. She has no other FNDs on exam. I explained the stroke found on MRI. She is currently CP free but did have some chest pressure moving from the bed to the MRI scanner and does report recurrent chest pressure with minimal exertion. The risks/benefits of leaving her on ASA/heparin in the setting of NSTEMI with unrevascularized CAD favor staying on anticoagulation/AP. We likely will delay cath until after repeat scans are done in 24h to ensure stability of CVA before committing her to uninterrupted DAPT in the setting of PCI. Will discuss with rounding team in the morning. For now will leave NPO. She understands the plan. Will f/u on any additional neuro recs.

## 2020-09-10 NOTE — Progress Notes (Signed)
STROKE TEAM PROGRESS NOTE   INTERVAL HISTORY Her husband is at the bedside. . I have personally reviewed history of presenting illness with the patient and her husband electronic medical records as pertinent imaging films in PACS.  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  He states she is feeling a lot better today  Vitals:   09/10/20 0625 09/10/20 0820 09/10/20 1047 09/10/20 1056  BP: 122/62 130/66  111/61  Pulse: 65 65  75  Resp:      Temp: 98.2 F (36.8 C) 98.3 F (36.8 C)  98.1 F (36.7 C)  TempSrc: Oral Oral  Oral  SpO2: 99% 100% 96% 94%  Weight:      Height:       CBC:  Recent Labs  Lab 09/09/20 0451 09/10/20 0601  WBC 8.0 6.3  HGB 10.5* 10.8*  HCT 30.2* 31.6*  MCV 97.1 97.2  PLT 214 213   Basic Metabolic Panel:  Recent Labs  Lab 09/09/20 0451 09/10/20 0601  NA 137 138  K 3.8 3.5  CL 108 105  CO2 24 27  GLUCOSE 123* 102*  BUN 10 11  CREATININE 0.51 0.55  CALCIUM 8.3* 8.4*  MG 1.8 1.9   Lipid Panel:  Recent Labs  Lab 09/10/20 0601  CHOL 133  TRIG 99  HDL 57  CHOLHDL 2.3  VLDL 20  LDLCALC 56   HgbA1c:  Recent Labs  Lab 09/10/20 0601  HGBA1C 5.9*   Urine Drug Screen: No results for input(s): LABOPIA, COCAINSCRNUR, LABBENZ, AMPHETMU, THCU, LABBARB in the last 168 hours.  Alcohol Level No results for input(s): ETH in the last 168 hours.  IMAGING past 24 hours  MR ANGIO HEAD WO CONTRAST Result Date: 09/10/2020 IMPRESSION:  1. Posterior circulation appears patent with no origin or proximal occlusion of right cerebellar arteries. Dominant right vertebral artery. 2. 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 Ectatic right MCA trifurcation.  MR BRAIN W WO CONTRAST Result Date: 09/10/2020 IMPRESSION:  1. Right PICA territory acute infarct. No hemorrhage  or mass effect. 2. Findings of chronic microvascular ischemia.   09/09/2020 TTE: 1. Left ventricular ejection fraction, by estimation, is 65 to 70%. The  left ventricle has normal function. The left ventricle has no regional  wall motion abnormalities. Left ventricular diastolic parameters are  consistent with Grade I diastolic dysfunction (impaired relaxation).  2. Right ventricular systolic function is normal. The right ventricular  size is normal. There is normal pulmonary artery systolic pressure.  3. Left atrial size was mildly dilated.  4. The mitral valve is normal in structure. No evidence of mitral valve  regurgitation. No evidence of mitral stenosis.  5. The aortic valve is tricuspid. Aortic valve regurgitation is not  visualized. No aortic stenosis is present.  6. The inferior vena cava is normal in size with greater than 50%  respiratory variability, suggesting right atrial pressure of 3 mmHg.   PHYSICAL EXAM  Obese elderly Caucasian lady sitting comfortably in bed not in distress. HEENT-  [place]/AT   Lungs- Respirations unlabored Extremities- No edema  Neurological Examination Mental Status: Awake and alert, fully oriented, thought content appropriate.  Speech fluent without evidence of aphasia.  Able to follow all commands without difficulty. Cranial Nerves: II: Temporal visual fields intact with no extinction to DSS. PERRL.   III,IV, VI: No ptosis. EOMI. Sustained nystagmus is  noted on end gaze to the right.  Mild saccadic dysmetria on right gaze. V,VII: Smile symmetric, facial temp sensation equal bilaterally VIII: hearing intact to conversation IX,X: No hypophonia XI: Symmetric XII: midline tongue extension Motor: RUE 5/5 proximally and distally LUE 5/5 proximally and distally RLE 2/5 hip flexion, 3/5 knee extension 1/5 proximal (weakness is chronic) LLE 5/5 proximally and distally Sensory: Decreased temp and FT sensation to RLE Cerebellar: No ataxia with FNF  bilaterally on repeated trials.  Gait: Unable to assess due to RLE weakness  ASSESSMENT/PLAN Ms. Raynisha Avilla is a 75 y.o. female with history of MS, HTN, prediabetes and restless leg syndrome who initially presented to [facility] with chest tightness. She was diagnosed with NSTEMI. She had a syncopal episode there and subsequently was noted to be very somnolent and only mildly responsive. A STAT head CT was negative. She was transferred to [facility].  An MRI was obtained due to continued somnolence. The MRI scan revealed an acute right PICA territory ischemic infarction.  Stroke:  right PICA territory acute infarct likely secondary to cardioembolic given NSTEMI  CT head: No acute intracranial findings. 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  MRI brain: Right PICA territory acute infarct. Chronic microvascular ischemia.  MRA head: Non dominant left vertebral artery with normal left PICA origin. PCA origins are within normal limits. Posterior       communicating arteries are diminutive or absent. Tortuous left P1       segment. Bilateral PCA branches are within normal limits.  CT head in the am  Carotid Doppler  pending  2D Echo: EF 65-70%, grade 1 diastolic dysfunction, left atrial size mildly dilated  LDL 56  HgbA1c 5.9  VTE prophylaxis - heparin infusion for MI NPO for possible LHC  No antithrombotic prior to admission, now on aspirin 81 mg daily. Currently on Heparin infusion for NSTEMI  Therapy recommendations:  pending  Disposition:  pending  Multiple Sclerosis  Treated at [facility]  Previously on multiple MS medications (5 years ago) but unable to tolerate side effects.  Encouraged patient to consider consulting with primary neurologist for newer MS treatment regimens as the patient noticed left lower extremity weakness 2 weeks ago likely due to MS  NSTEMI  Presented with chest pain at OSH  Hs troponin elevated at 1,900 ->  609  Heparin Infusion for MI (outweigh the risks of hemorrhagic conversion of the right PICA stroke)   If left heart cath is necessary for treatment of current MI, overall benefit will outweigh the risk of hemorrhagic conversion of the stroke from a neurological standpoint   Aspirin 81mg , metoprolol 25mg  daily  Hypertension  Home meds: none  Stable . Permissive hypertension (OK if < 220/120) but gradually normalize in 5-7 days . Long-term BP goal normotensive  Hyperlipidemia  Home meds:  none  LDL 56, goal < 70  Add atorvastatin 80mg  daily  Continue statin at discharge  Diabetes type II Controlled  Home meds:  none  HgbA1c 5.9, goal < 7.0  CBGs  SSI  Other Stroke Risk Factors  Advanced Age >/= 1   Remote Cigarette smoker quit 12 years ago  ETOH use, no alcohol level available.  Drinks 1-2 glasses of wine per week.  Obesity, Body mass index is 33.83 kg/m., BMI >/= 30 associated with increased stroke risk, recommend weight loss, diet and exercise as appropriate   Coronary artery disease, recent MI,   Other Active Problems  MS  GERD  Hospital day # 2  Lissy Olivencia-Simmons, ACNP-BC Stroke NP 09/10/2020 at 2:00pm I have personally obtained history,examined this patient, reviewed notes, independently viewed imaging studies, participated in medical decision making and plan of care.ROS completed by me personally and pertinent positives fully documented  I have made any additions or clarifications directly to the above note. Agree with note above.  Patient presented with chest discomfort due to NSTEMi and developed dizziness due to right PICA infarct likely of cardioembolic etiology.  Continue IV heparin short-term.  Patient may need cardiac catheterization and revascularization if necessary with angioplasty stenting.  There is a slight risk of hemorrhagic transformation of her acute infarct but given the risk-benefit may be worth the risk.  If cardiology feels  it is no long-term indication for anticoagulation purely based on her NSTEMI  may consider doing a loop recorder at discharge to look for paroxysmal A. fib.  Aspirin and heparin for now but may need dual antiplatelet therapy at discharge.  Patient was factor modification.  Greater than 50% time during this 35-minute visit was spent on counseling and coordination of care about her embolic stroke discussion about evaluation and treatment and answering questions.  Discussed with patient, husband and cardiology team  Delia Heady, MD Medical Director [facility]  09/10/2020 4:44 PM  To contact Stroke Continuity provider, please refer to [email or web address]. After hours, contact General Neurology

## 2020-09-10 NOTE — Progress Notes (Addendum)
Progress Note  Patient Name: Caitlin Pierce Date of Encounter: 09/10/2020  Primary Cardiologist: Dr. Laurance Flatten, MD   Subjective   More awake today and appears to be back to baseline. Does report that she had acute onset of LLE weakness about 2 weeks ago that she contributed to her MS. Neur recommends re-imaging tomorrow.    Inpatient Medications    Scheduled Meds:  aspirin EC  81 mg Oral Daily   atorvastatin  80 mg Oral Daily   metoprolol succinate  25 mg Oral Daily   Continuous Infusions:  heparin 1,250 Units/hr (09/09/20 1507)   nitroGLYCERIN 15 mcg/min (09/10/20 0853)   PRN Meds: acetaminophen, ondansetron (ZOFRAN) IV   Vital Signs    Vitals:   09/10/20 0625 09/10/20 0820 09/10/20 1047 09/10/20 1056  BP: 122/62 130/66  111/61  Pulse: 65 65  75  Resp:      Temp: 98.2 F (36.8 C) 98.3 F (36.8 C)  98.1 F (36.7 C)  TempSrc: Oral Oral  Oral  SpO2: 99% 100% 96% 94%  Weight:      Height:        Intake/Output Summary (Last 24 hours) at 09/10/2020 1057 Last data filed at 09/10/2020 0423 Gross per 24 hour  Intake 600 ml  Output 2300 ml  Net -1700 ml   Filed Weights   09/08/20 1610 09/09/20 0507  Weight: 89.4 kg 89.4 kg    Physical Exam   General: Well developed, well nourished, NAD Neck: Negative for carotid bruits. No JVD Lungs: Bilateral upper and lower lobe crackles. Breathing is unlabored. Cardiovascular: RRR with S1 S2. No murmurs Abdomen: Soft, non-tender, non-distended. No obvious abdominal masses. Extremities: No edema. Radial pulses 2+ bilaterally Neuro: Alert and oriented. No focal deficits. No facial asymmetry. MAE spontaneously. Psych: Responds to questions appropriately with normal affect.    Labs    Chemistry Recent Labs  Lab 09/08/20 1706 09/09/20 0451 09/10/20 0601  NA 137 137 138  K 3.9 3.8 3.5  CL 111 108 105  CO2 22 24 27   GLUCOSE 138* 123* 102*  BUN 13 10 11   CREATININE 0.59 0.51 0.55  CALCIUM 8.2* 8.3* 8.4*   GFRNONAA >60 >60 >60  ANIONGAP 4* 5 6     Hematology Recent Labs  Lab 09/09/20 0451 09/10/20 0601  WBC 8.0 6.3  RBC 3.11* 3.25*  HGB 10.5* 10.8*  HCT 30.2* 31.6*  MCV 97.1 97.2  MCH 33.8 33.2  MCHC 34.8 34.2  RDW 18.6* 18.4*  PLT 214 213    Cardiac EnzymesNo results for input(s): TROPONINI in the last 168 hours. No results for input(s): TROPIPOC in the last 168 hours.   BNP Recent Labs  Lab 09/08/20 1706  BNP 43.0     DDimer No results for input(s): DDIMER in the last 168 hours.   Radiology    CT HEAD WO CONTRAST  Result Date: 09/08/2020 CLINICAL DATA:  Confusion EXAM: CT HEAD WITHOUT CONTRAST TECHNIQUE: Contiguous axial images were obtained from the base of the skull through the vertex without intravenous contrast. COMPARISON:  None. FINDINGS: Brain: No evidence of acute infarction, hemorrhage, hydrocephalus, extra-axial collection or mass lesion/mass effect. Mild-moderate low-density changes within the periventricular and subcortical white matter compatible with chronic microvascular ischemic change. Mild diffuse cerebral volume loss. Vascular: Atherosclerotic calcifications involving the large vessels of the skull base. No unexpected hyperdense vessel. Skull: Normal. Negative for fracture or focal lesion. Sinuses/Orbits: No acute finding. Other: None. IMPRESSION: 1. No acute intracranial findings.  2. 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 Electronically Signed   By: Duanne GuessNicholas  Plundo D.O.   On: 09/08/2020 18:00   MR ANGIO HEAD WO CONTRAST  Result Date: 09/10/2020 CLINICAL DATA:  75 year old female presenting with confusion. Right cerebellar infarct on brain MRI earlier today. EXAM: MRA HEAD WITHOUT CONTRAST TECHNIQUE: Angiographic images of the Circle of Willis were obtained using MRA technique without intravenous contrast. COMPARISON:  brain MRI 0047 hours today. FINDINGS: No significant intracranial mass effect is evident. No ventriculomegaly.  Antegrade flow in the posterior circulation in the distal right vertebral artery appears dominant. There is no distal right vertebral stenosis. Patent vertebrobasilar junction. Right PICA origin does appear to be patent on series 20, image 55. And there is also faint visualization of the right AICA, better than that on the left. Proximal SCA is also appear symmetric and patent. Non dominant left vertebral artery with normal left PICA origin. Patent left vertebrobasilar junction. Patent basilar artery without stenosis. PCA origins are within normal limits. Posterior communicating arteries are diminutive or absent. Tortuous left P1 segment. Bilateral PCA branches are within normal limits. Antegrade flow in both ICA siphons. Normal ophthalmic artery origins. Mild bilateral siphon irregularity but no significant siphon stenosis. Patent carotid termini. Normal MCA and ACA origins. Anterior communicating artery and visible ACA branches are within normal limits. Left MCA M1 bifurcates early without stenosis. Right MCA M1 is patent to the trifurcation. The right MCA trifurcation is ectatic but patent without stenosis. No discrete aneurysm identified. Otherwise the visible bilateral MCA branches are within normal limits. IMPRESSION: 1. Posterior circulation appears patent with no origin or proximal occlusion of right cerebellar arteries. Dominant right vertebral artery. 2. 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 Ectatic right MCA trifurcation. Otherwise negative anterior circulation. Electronically Signed   By: Odessa FlemingH  Hall M.D.   On: 09/10/2020 05:07   MR BRAIN W WO CONTRAST  Result Date: 09/10/2020 CLINICAL DATA:  Delirium EXAM: MRI HEAD WITHOUT AND WITH CONTRAST TECHNIQUE: Multiplanar, multiecho pulse sequences of the brain and surrounding structures were obtained without and with intravenous contrast. CONTRAST:  9mL GADAVIST GADOBUTROL 1 MMOL/ML IV SOLN COMPARISON:  None. FINDINGS: Brain: Right PICA  territory acute infarct. No acute or chronic hemorrhage. There is multifocal hyperintense T2-weighted signal within the white matter. Parenchymal volume and CSF spaces are normal. The midline structures are normal. There is no abnormal contrast enhancement. Vascular: Major flow voids are preserved. Skull and upper cervical spine: Normal calvarium and skull base. Visualized upper cervical spine and soft tissues are normal. Sinuses/Orbits:No paranasal sinus fluid levels or advanced mucosal thickening. No mastoid or middle ear effusion. Normal orbits. IMPRESSION: 1. Right PICA territory acute infarct. No hemorrhage or mass effect. 2. Findings of chronic microvascular ischemia. Electronically Signed   By: Deatra RobinsonKevin  Herman M.D.   On: 09/10/2020 02:44   ECHOCARDIOGRAM COMPLETE  Result Date: 09/09/2020    ECHOCARDIOGRAM REPORT   Patient Name:   Caitlin Pierce Date of Exam: 09/09/2020 Medical Rec #:  [id number]   Height:       64.0 in Accession #:    [id number]  Weight:       197.1 lb Date of Birth:  Sep 08, 1945   BSA:          1.944 m Patient Age:    74 years    BP:           111/56 mmHg Patient Gender: F           HR:  71 bpm. Exam Location:  Inpatient Procedure: 2D Echo, Cardiac Doppler and Color Doppler Indications:    NSTEMI  History:        Patient has no prior history of Echocardiogram examinations.                 Acute MI.  Sonographer:    Neomia Dear RDCS Referring Phys: [id number] HEATHER E [place] IMPRESSIONS  1. Left ventricular ejection fraction, by estimation, is 65 to 70%. The left ventricle has normal function. The left ventricle has no regional wall motion abnormalities. Left ventricular diastolic parameters are consistent with Grade I diastolic dysfunction (impaired relaxation).  2. Right ventricular systolic function is normal. The right ventricular size is normal. There is normal pulmonary artery systolic pressure.  3. Left atrial size was mildly dilated.  4. The mitral valve is normal in structure.  No evidence of mitral valve regurgitation. No evidence of mitral stenosis.  5. The aortic valve is tricuspid. Aortic valve regurgitation is not visualized. No aortic stenosis is present.  6. The inferior vena cava is normal in size with greater than 50% respiratory variability, suggesting right atrial pressure of 3 mmHg. FINDINGS  Left Ventricle: Left ventricular ejection fraction, by estimation, is 65 to 70%. The left ventricle has normal function. The left ventricle has no regional wall motion abnormalities. The left ventricular internal cavity size was normal in size. There is  no left ventricular hypertrophy. Left ventricular diastolic parameters are consistent with Grade I diastolic dysfunction (impaired relaxation). Indeterminate filling pressures. Right Ventricle: The right ventricular size is normal. No increase in right ventricular wall thickness. Right ventricular systolic function is normal. There is normal pulmonary artery systolic pressure. The tricuspid regurgitant velocity is 2.43 m/s, and  with an assumed right atrial pressure of 3 mmHg, the estimated right ventricular systolic pressure is 26.6 mmHg. Left Atrium: Left atrial size was mildly dilated. Right Atrium: Right atrial size was normal in size. Pericardium: Trivial pericardial effusion is present. Mitral Valve: The mitral valve is normal in structure. No evidence of mitral valve regurgitation. No evidence of mitral valve stenosis. Tricuspid Valve: The tricuspid valve is normal in structure. Tricuspid valve regurgitation is mild . No evidence of tricuspid stenosis. Aortic Valve: The aortic valve is tricuspid. Aortic valve regurgitation is not visualized. No aortic stenosis is present. Aortic valve mean gradient measures 4.0 mmHg. Aortic valve peak gradient measures 8.6 mmHg. Aortic valve area, by VTI measures 3.05 cm. Pulmonic Valve: The pulmonic valve was normal in structure. Pulmonic valve regurgitation is trivial. No evidence of pulmonic  stenosis. Aorta: The aortic root is normal in size and structure. Venous: The inferior vena cava is normal in size with greater than 50% respiratory variability, suggesting right atrial pressure of 3 mmHg. IAS/Shunts: No atrial level shunt detected by color flow Doppler.  LEFT VENTRICLE PLAX 2D LVIDd:         4.60 cm     Diastology LVIDs:         2.90 cm     LV e' medial:    6.42 cm/s LV PW:         1.10 cm     LV E/e' medial:  12.3 LV IVS:        0.95 cm     LV e' lateral:   10.70 cm/s LVOT diam:     2.30 cm     LV E/e' lateral: 7.4 LV SV:         91 LV SV Index:  47 LVOT Area:     4.15 cm  LV Volumes (MOD) LV vol d, MOD A2C: 78.3 ml LV vol d, MOD A4C: 71.5 ml LV vol s, MOD A2C: 30.1 ml LV vol s, MOD A4C: 22.2 ml LV SV MOD A2C:     48.2 ml LV SV MOD A4C:     71.5 ml LV SV MOD BP:      48.5 ml LEFT ATRIUM             Index       RIGHT ATRIUM           Index LA diam:        3.70 cm 1.90 cm/m  RA Area:     16.80 cm LA Vol (A2C):   79.6 ml 40.94 ml/m RA Volume:   44.10 ml  22.68 ml/m LA Vol (A4C):   72.0 ml 37.03 ml/m LA Biplane Vol: 80.1 ml 41.20 ml/m  AORTIC VALVE                   PULMONIC VALVE AV Area (Vmax):    3.11 cm    PV Vmax:       1.09 m/s AV Area (Vmean):   3.18 cm    PV Vmean:      73.300 cm/s AV Area (VTI):     3.05 cm    PV VTI:        0.228 m AV Vmax:           147.00 cm/s PV Peak grad:  4.8 mmHg AV Vmean:          93.700 cm/s PV Mean grad:  3.0 mmHg AV VTI:            0.297 m AV Peak Grad:      8.6 mmHg AV Mean Grad:      4.0 mmHg LVOT Vmax:         110.00 cm/s LVOT Vmean:        71.800 cm/s LVOT VTI:          0.218 m LVOT/AV VTI ratio: 0.73  AORTA Ao Root diam: 3.30 cm Ao Asc diam:  3.10 cm MITRAL VALVE               TRICUSPID VALVE MV Area (PHT): 4.71 cm    TR Peak grad:   23.6 mmHg MV Decel Time: 161 msec    TR Vmax:        243.00 cm/s MV E velocity: 78.80 cm/s MV A velocity: 95.50 cm/s  SHUNTS MV E/A ratio:  0.83        Systemic VTI:  0.22 m                            Systemic Diam:  2.30 cm Chilton Si MD Electronically signed by Chilton Si MD Signature Date/Time: 09/09/2020/12:44:47 PM    Final    Telemetry    09/10/20 NSR- Personally Reviewed  ECG    No new tracing as of 09/10/20- Personally Reviewed  Cardiac Studies   Echocardiogram 09/09/20:  1. Left ventricular ejection fraction, by estimation, is 65 to 70%. The  left ventricle has normal function. The left ventricle has no regional  wall motion abnormalities. Left ventricular diastolic parameters are  consistent with Grade I diastolic  dysfunction (impaired relaxation).  2. Right ventricular systolic function is normal. The right ventricular  size is normal. There is normal pulmonary artery  systolic pressure.  3. Left atrial size was mildly dilated.  4. The mitral valve is normal in structure. No evidence of mitral valve  regurgitation. No evidence of mitral stenosis.  5. The aortic valve is tricuspid. Aortic valve regurgitation is not  visualized. No aortic stenosis is present.  6. The inferior vena cava is normal in size with greater than 50%  respiratory variability, suggesting right atrial pressure of 3 mmHg.   Patient Profile     75 y.o. female with a hx of MS, prior tobacco use, and no known cardiac history who initially presented to [facility] with chest pain found to have trop elevation consistent with NSTEMI. [facility] hospital complicated by syncopal episode after using the bathroom prompting more urgent transfer to [facility] hospital. Course her complicated by AMS/significant and somnolence found to have acute right PICA ischemic infarction.   Assessment & Plan    1. NSTEMI: -Presented to [facility] with chest pain with no know CAD>>HsT elevated and plan was for transfer to [facility] for LHC. Unfortunately she had acute confusion and somnolence with head imagining revealing acute right PICA ischaemic infarct. She was left on IV Heparin for now. Plan per neurology is for repeat imaging in  24H. She had very mild chest pressure this morning however has otherwise been stable from a CV standpoint. Once ok from neurology team, will plan for ischemic evaluation however given the likely need for DAPT, will wait until neuro status is more stable.  -Continue ASA, statin, beta blocker -Continue Heparin, IV NTG -Echo performed yesterday with normal LV function, no WMA, and G1DD  When I saw her this afternoon, she was no longer have any chest pain.  There is no absolute indication for cardiac catheterization in the setting of non-STEMI.  The indication is for symptomatic relief in the acute setting.  We need to ensure that she is stable having had a recent stroke.  We initially did ensure there is no hemorrhagic conversion with CTA tomorrow.  We can then reassess her symptoms to determine if she indeed needs urgent catheterization versus noninvasive ischemic evaluation most likely with coronary CTA as this gives [place] a more definitive answer than nuclear stress test (especially in the posterior peri-MI setting)  For now the plan will be to continue with medical therapy with aspirin statin beta-blocker and heparin.  Currently on IV nitroglycerin which will convert to p.o. Imdur tomorrow.  We need to reassess symptoms over the next couple days to determine if we need urgent catheterization versus delay with noninvasive ischemic evaluation to restratify.  2. Acute right PICA ischemic infarction: -Found this admission after acute episode of confusion>>>neurology consulted with brain MRI completed that showed acute right PICA ischemic infarct. Pt reports that her "strong" leg is typically her left leg as her right is always weaker due to MS>she states that 2 weeks ago she has more weakness to her LLE which was new for her.    Concern with recent stroke is that this increases risk of stroke for cardiac catheterization.  For now it appears that the plan is to relook with CTA tomorrow to assure no  bleeding conversion on IV heparin.  Would not want to give bolus of heparin in the Cath Lab to reassure of that.  Would need at least 24 hours to recover from CTA of the head to confirm no bleeding on IV heparin prior to any other contrasted study..   3. MS: -Not currently on medications however follows closely  with [facility] with Q6M MRI scans.    Signed, Georgie Chard NP-C [facility] Pager: [phone] 09/10/2020, 10:57 AM      ATTENDING ATTESTATION  I have seen, examined and evaluated the patient this PM along with Clyde Canterbury.  After reviewing all the available data and chart, we discussed the patients laboratory, study & physical findings as well as symptoms in detail. I agree with her findings, examination as well as impression recommendations as per our discussion.    Attending adjustments noted in italics.   Very difficult situation.  Her presenting symptom was clearly ACS with a non-STEMI, unfortunately during the initial evaluation she became quite sick and nauseated -treated with medications and made her quite somnolent and almost obtunded upon arrival here to [facility].  During part of the evaluation she was found to have a stroke. Neurology is indicated that she should be safe to do stroke, however as an interventionalists, we need to ensure that there is no chance of hemorrhagic conversion.  Typically, the recommendation would be to wait 2 to 4 weeks post stroke before doing invasive cardiac catheterization procedures.  However if her symptoms warrant more aggressive options, we can proceed if necessary.  At this point my preference would be diaphanous medical therapy, reassess symptoms, and if not having active anginal symptoms with rest or minimal exertion, will do a noninvasive ischemic evaluation (probably best with coronary CTA) for restratification.  This will help direct whether or not we need to do emergent or urgent catheterization procedure.  Need at least 24  hours of hydration between CT scans and then potential cath.     Bryan Lemma, M.D., M.S. Interventional Cardiologist   Pager # [phone] Phone # [phone] [address] [place], [place] [address]    For questions or updates, please contact   Please consult [email or web address] for contact info under Cardiology/STEMI.

## 2020-09-10 NOTE — Consult Note (Signed)
NEURO HOSPITALIST CONSULT NOTE   Requesting physician: Dr. 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 female with a history of MS, HTN, prediabetes and RLS who initially presented to [facility] with cardiac symptoms. She was diagnosed with NSTEMI. She had a syncopal episode at [facility] and subsequently was noted to be very somnolent and only mildly responsive. A STAT head CT was negative. An MRI was obtained due to continued somnolence after she was admited to [facility]. The MRI scan revealed an acute right PICA territory ischemic infarction. The patient states that she was diagnosed with MS many years ago. She has chronic right lower extremity weakness and sensory loss due to her MS.   MRI brain: 1. Right PICA territory acute infarct. No hemorrhage or mass effect. 2. Findings of chronic microvascular ischemia.  TTE: 1. Left ventricular ejection fraction, by estimation, is 65 to 70%. The  left ventricle has normal function. The left ventricle has no regional  wall motion abnormalities. Left ventricular diastolic parameters are  consistent with Grade I diastolic dysfunction (impaired relaxation).  2. Right ventricular systolic function is normal. The right ventricular  size is normal. There is normal pulmonary artery systolic pressure.  3. Left atrial size was mildly dilated.  4. The mitral valve is normal in structure. No evidence of mitral valve  regurgitation. No evidence of mitral stenosis.  5. The aortic valve is tricuspid. Aortic valve regurgitation is not  visualized. No aortic stenosis is present.  6. The inferior vena cava is normal in size with greater than 50%  respiratory variability, suggesting right atrial  pressure of 3 mmHg.   Past Medical History:  Diagnosis Date   Chronic back pain    Essential hypertension    GERD (gastroesophageal reflux disease)    MS (multiple sclerosis) (HCC)    Prediabetes    RLS (restless legs syndrome)     Past Surgical History:  Procedure Laterality Date   EYE SURGERY     TMJ ARTHROPLASTY      History reviewed. No pertinent family history.            Social History:  reports that she quit smoking about 12 years ago. She has never used smokeless tobacco. She reports current alcohol use. She reports previous drug use.  No Known Allergies  MEDICATIONS:  Prior to Admission:  Medications Prior to Admission  Medication Sig Dispense Refill Last Dose   ibuprofen (ADVIL) 200 MG tablet Take 600 mg by mouth every 6 (six) hours as needed for headache (pain).   unknown   rOPINIRole (REQUIP) 1 MG tablet Take 1-4 mg by mouth See admin instructions. Take one tablet (1 mg) by mouth twice daily as needed for restless legs, take four tablets (4 mg) at bedtime   09/06/2020   Scheduled:  aspirin EC  81 mg Oral Daily   atorvastatin  80 mg Oral Daily   metoprolol succinate  25 mg Oral Daily   Continuous:  heparin 1,250 Units/hr (09/09/20 1507)   nitroGLYCERIN 10 mcg/min (09/09/20 1508)     ROS:                                                                                                                                       As per HPI. Does not endorse any additional symptoms at the time of Neurology evaluation.   Blood pressure (!) 105/50, pulse 60, temperature 98.3 F (36.8 C), temperature source Oral, resp. rate 14, height 5\' 4"  (1.626 m), Pierce 89.4 kg, SpO2 99 %.   General Examination:                                                                                                       Physical Exam  HEENT-  [place]/AT   Lungs-  Respirations unlabored Extremities- No edema  Neurological Examination Mental Status: Awake and alert, fully oriented, thought content appropriate.  Speech fluent without evidence of aphasia.  Able to follow all commands without difficulty. Cranial Nerves: II: Temporal visual fields intact with no extinction to DSS. PERRL.   III,IV, VI: No ptosis. EOMI. Sustained nystagmus is noted on endgaze to the right and the left.  V,VII: Smile symmetric, facial temp sensation equal bilaterally VIII: hearing intact to conversation IX,X: No hypophonia XI: Symmetric XII: midline tongue extension Motor: RUE 5/5 proximally and distally LUE 5/5 proximally and distally RLE 2/5 hip flexion, 3/5 knee extension, 4+/5 ADF. Patient states that her RLE weakness is chronic LLE 5/5 proximally and distally Sensory: Decreased temp and FT sensation to RLE Deep Tendon Reflexes: 2+ and symmetric brachioradialis, biceps and patellae Plantars: Right: downgoing  Left: downgoing Cerebellar: No ataxia with FNF bilaterally on repeated trials.  Gait: Unable to assess due to RLE weakness   Lab Results: Basic Metabolic Panel: Recent Labs  Lab 09/08/20 1706 09/09/20 0451  NA 137 137  K 3.9 3.8  CL 111 108  CO2 22 24  GLUCOSE 138* 123*  BUN 13 10  CREATININE 0.59 0.51  CALCIUM 8.2* 8.3*  MG  --  1.8    CBC: Recent Labs  Lab 09/09/20 0451  WBC 8.0  HGB 10.5*  HCT 30.2*  MCV 97.1  PLT 214    Cardiac Enzymes: No results for input(s): CKTOTAL, CKMB, CKMBINDEX, TROPONINI in the last 168 hours.  Lipid Panel: No results for input(s): CHOL, TRIG, HDL, CHOLHDL, VLDL, LDLCALC in the last 168 hours.  Imaging: CT HEAD WO CONTRAST  Result Date: 09/08/2020 CLINICAL DATA:  Confusion EXAM: CT HEAD WITHOUT CONTRAST TECHNIQUE: Contiguous axial images were obtained from the base of the skull through the vertex without intravenous contrast. COMPARISON:  None. FINDINGS: Brain: No evidence of acute infarction,  hemorrhage, hydrocephalus, extra-axial collection or mass lesion/mass effect. Mild-moderate low-density changes within the periventricular and subcortical white matter compatible with chronic microvascular ischemic change. Mild diffuse cerebral volume loss. Vascular: Atherosclerotic calcifications involving the large vessels of the skull base. No unexpected hyperdense vessel. Skull: Normal. Negative for fracture or focal lesion. Sinuses/Orbits: No acute finding. Other: None. IMPRESSION: 1. No acute intracranial findings. 2. 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 Electronically Signed   By: Duanne Guess D.O.   On: 09/08/2020 18:00   MR BRAIN W WO CONTRAST  Result Date: 09/10/2020 CLINICAL DATA:  Delirium EXAM: MRI HEAD WITHOUT AND WITH CONTRAST TECHNIQUE: Multiplanar, multiecho pulse sequences of the brain and surrounding structures were obtained without and with intravenous contrast. CONTRAST:  45mL GADAVIST GADOBUTROL 1 MMOL/ML IV SOLN COMPARISON:  None. FINDINGS: Brain: Right PICA territory acute infarct. No acute or chronic hemorrhage. There is multifocal hyperintense T2-weighted signal within the white matter. Parenchymal volume and CSF spaces are normal. The midline structures are normal. There is no abnormal contrast enhancement. Vascular: Major flow voids are preserved. Skull and upper cervical spine: Normal calvarium and skull base. Visualized upper cervical spine and soft tissues are normal. Sinuses/Orbits:No paranasal sinus fluid levels or advanced mucosal thickening. No mastoid or middle ear effusion. Normal orbits. IMPRESSION: 1. Right PICA territory acute infarct. No hemorrhage or mass effect. 2. Findings of chronic microvascular ischemia. Electronically Signed   By: Deatra Robinson M.D.   On: 09/10/2020 02:44   ECHOCARDIOGRAM COMPLETE  Result Date: 09/09/2020    ECHOCARDIOGRAM REPORT   Patient Name:   Caitlin Pierce Date of Exam: 09/09/2020 Medical Rec #:  [id number]    Height:       64.0 in Accession #:    [id number]  Pierce:       197.1 lb Date of Birth:  12-16-1945   BSA:          1.944 m Patient Age:    74 years    BP:           111/56 mmHg Patient Gender: F           HR:           71 bpm. Exam Location:  Inpatient Procedure: 2D Echo, Cardiac Doppler and Color Doppler Indications:    NSTEMI  History:        Patient has no prior history of Echocardiogram examinations.                 Acute MI.  Sonographer:    Neomia Dear RDCS Referring Phys: [id number] HEATHER E [place] IMPRESSIONS  1. Left ventricular ejection fraction, by estimation,  is 65 to 70%. The left ventricle has normal function. The left ventricle has no regional wall motion abnormalities. Left ventricular diastolic parameters are consistent with Grade I diastolic dysfunction (impaired relaxation).  2. Right ventricular systolic function is normal. The right ventricular size is normal. There is normal pulmonary artery systolic pressure.  3. Left atrial size was mildly dilated.  4. The mitral valve is normal in structure. No evidence of mitral valve regurgitation. No evidence of mitral stenosis.  5. The aortic valve is tricuspid. Aortic valve regurgitation is not visualized. No aortic stenosis is present.  6. The inferior vena cava is normal in size with greater than 50% respiratory variability, suggesting right atrial pressure of 3 mmHg. FINDINGS  Left Ventricle: Left ventricular ejection fraction, by estimation, is 65 to 70%. The left ventricle has normal function. The left ventricle has no regional wall motion abnormalities. The left ventricular internal cavity size was normal in size. There is  no left ventricular hypertrophy. Left ventricular diastolic parameters are consistent with Grade I diastolic dysfunction (impaired relaxation). Indeterminate filling pressures. Right Ventricle: The right ventricular size is normal. No increase in right ventricular wall thickness. Right ventricular systolic function is  normal. There is normal pulmonary artery systolic pressure. The tricuspid regurgitant velocity is 2.43 m/s, and  with an assumed right atrial pressure of 3 mmHg, the estimated right ventricular systolic pressure is 26.6 mmHg. Left Atrium: Left atrial size was mildly dilated. Right Atrium: Right atrial size was normal in size. Pericardium: Trivial pericardial effusion is present. Mitral Valve: The mitral valve is normal in structure. No evidence of mitral valve regurgitation. No evidence of mitral valve stenosis. Tricuspid Valve: The tricuspid valve is normal in structure. Tricuspid valve regurgitation is mild . No evidence of tricuspid stenosis. Aortic Valve: The aortic valve is tricuspid. Aortic valve regurgitation is not visualized. No aortic stenosis is present. Aortic valve mean gradient measures 4.0 mmHg. Aortic valve peak gradient measures 8.6 mmHg. Aortic valve area, by VTI measures 3.05 cm. Pulmonic Valve: The pulmonic valve was normal in structure. Pulmonic valve regurgitation is trivial. No evidence of pulmonic stenosis. Aorta: The aortic root is normal in size and structure. Venous: The inferior vena cava is normal in size with greater than 50% respiratory variability, suggesting right atrial pressure of 3 mmHg. IAS/Shunts: No atrial level shunt detected by color flow Doppler.  LEFT VENTRICLE PLAX 2D LVIDd:         4.60 cm     Diastology LVIDs:         2.90 cm     LV e' medial:    6.42 cm/s LV PW:         1.10 cm     LV E/e' medial:  12.3 LV IVS:        0.95 cm     LV e' lateral:   10.70 cm/s LVOT diam:     2.30 cm     LV E/e' lateral: 7.4 LV SV:         91 LV SV Index:   47 LVOT Area:     4.15 cm  LV Volumes (MOD) LV vol d, MOD A2C: 78.3 ml LV vol d, MOD A4C: 71.5 ml LV vol s, MOD A2C: 30.1 ml LV vol s, MOD A4C: 22.2 ml LV SV MOD A2C:     48.2 ml LV SV MOD A4C:     71.5 ml LV SV MOD BP:      48.5 ml LEFT ATRIUM  Index       RIGHT ATRIUM           Index LA diam:        3.70 cm 1.90 cm/m  RA  Area:     16.80 cm LA Vol (A2C):   79.6 ml 40.94 ml/m RA Volume:   44.10 ml  22.68 ml/m LA Vol (A4C):   72.0 ml 37.03 ml/m LA Biplane Vol: 80.1 ml 41.20 ml/m  AORTIC VALVE                   PULMONIC VALVE AV Area (Vmax):    3.11 cm    PV Vmax:       1.09 m/s AV Area (Vmean):   3.18 cm    PV Vmean:      73.300 cm/s AV Area (VTI):     3.05 cm    PV VTI:        0.228 m AV Vmax:           147.00 cm/s PV Peak grad:  4.8 mmHg AV Vmean:          93.700 cm/s PV Mean grad:  3.0 mmHg AV VTI:            0.297 m AV Peak Grad:      8.6 mmHg AV Mean Grad:      4.0 mmHg LVOT Vmax:         110.00 cm/s LVOT Vmean:        71.800 cm/s LVOT VTI:          0.218 m LVOT/AV VTI ratio: 0.73  AORTA Ao Root diam: 3.30 cm Ao Asc diam:  3.10 cm MITRAL VALVE               TRICUSPID VALVE MV Area (PHT): 4.71 cm    TR Peak grad:   23.6 mmHg MV Decel Time: 161 msec    TR Vmax:        243.00 cm/s MV E velocity: 78.80 cm/s MV A velocity: 95.50 cm/s  SHUNTS MV E/A ratio:  0.83        Systemic VTI:  0.22 m                            Systemic Diam: 2.30 cm Chilton Si MD Electronically signed by Chilton Si MD Signature Date/Time: 09/09/2020/12:44:47 PM    Final       TTE:  1. Left ventricular ejection fraction, by estimation, is 65 to 70%. The  left ventricle has normal function. The left ventricle has no regional  wall motion abnormalities. Left ventricular diastolic parameters are  consistent with Grade I diastolic dysfunction (impaired relaxation).  2. Right ventricular systolic function is normal. The right ventricular  size is normal. There is normal pulmonary artery systolic pressure.  3. Left atrial size was mildly dilated.  4. The mitral valve is normal in structure. No evidence of mitral valve regurgitation. No evidence of mitral stenosis.  5. The aortic valve is tricuspid. Aortic valve regurgitation is not  visualized. No aortic stenosis is present.  6. The inferior vena cava is normal in size with  greater than 50%  respiratory variability, suggesting right atrial pressure of 3 mmHg.    Assessment: 75 y.o. female with a history of MS, HTN, prediabetes and RLS who initially presented to [facility] with cardiac symptoms. She was diagnosed with NSTEMI. She had a [facility]opal [facility] at  [facility] and subsequently was noted to be very somnolent and only mildly responsive. A STAT head CT was negative. An MRI was obtained due to continued somnolence after she was admited to [facility]. The MRI scan revealed an acute right PICA territory ischemic infarction. The patient states that she was diagnosed with MS many years ago. She has chronic right lower extremity weakness and sensory loss due to her MS.  1. Exam does not reveal ataxia. However, there is RLE weakness and sensory loss which are chronic and stated by patient to be secondary to her MS.  2. TTE yesterday revealed no mural thrombus or valvular vegetation 3. HgbA1C is 5.9 4. EKG: Normal sinus rhythm. Non-specific ST-t changes. 5. Stable MS.   Recommendations: 1. MRA brain and carotid ultrasound 2. Cardiac telemetry.  3. Benefits of heparin gtt for MI outweigh the risks of hemorrhagic conversion of the right PICA stroke.  4. Also on ASA per Cardiology. Both the heparin and the ASA will reduce the risk of recurrent stroke while inpatient and overall benefits of the combination from a cardiac standpoint are felt to outweigh the risk of hemorrhagic conversion of the stroke from a neurological standpoint. .  5. Most likely the benefits of cardiac catheterization in terms of long term mortality/morbidity risk outweigh the risk of periprocedural stroke.  6. Monitor clinically and with serial CT. The stroke is small enough to not require hypertonic saline currently.  7. Obtain follow up CT scan at 0200 tomorrow.    Electronically signed: Dr. Caryl Pina 09/10/2020, 3:31 AM

## 2020-09-10 NOTE — Progress Notes (Signed)
PROGRESS NOTE    Caitlin Pierce  ZOX:[id number] DOB: 05/15/1946 DOA: 09/08/2020 PCP: No primary care provider on file.   Brief Narrative: 75 year old past medical history significant for hypertension, MS, chronic back pain, RLS who presents on 3/12 to OSH with chest pain.  Patient was found to have non-STEMI and was transferred to [facility] for heart cath.  On arrival to [facility] patient was noted to have altered mental status after she received at prior hospital Phenergan and Zofran.  After further  evaluation for AMS  patient was found to have acute stroke on right PICA territory.  -Neurology has been consulted and has cleared patient to have heart cath.  Patient to continue with heparin drip for non-STEMI, with plan to repeat CT scan 3-15.   Assessment & Plan:   Active Problems:   NSTEMI (non-ST elevated myocardial infarction) (HCC)   MS (multiple sclerosis) (HCC)   GERD (gastroesophageal reflux disease)   Essential hypertension   Chronic back pain   Prediabetes   RLS (restless legs syndrome)  1-non-STEMI: -Patient presented to [facility] with chest pain, elevation of troponin changes and EKG consistent with non-STEMI.  Troponin 609. -Patient was admitted by cardiology, transfer to [facility] for heart cath. -On heparin drip. Nitroglycerin Gtt.  -Started on Lipitor, Metoprolol, aspirin.   2-Acute metabolic encephalopathy: Could be related to acute stroke versus medication -Patient currently back to baseline -Questionable syncope prior hospital.  Monitor on telemetry  3-Acute PICA  Territory infarct; -WJX:BJYNW PICA territory acute infarct. No hemorrhage or mass effect. -GNF:AOZHYQMVH circulation appears patent with no origin or proximal occlusion of right cerebellar arteries. Dominant right vertebral artery. 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 Ectatic right MCA trifurcation. Otherwise negative anterior circulation. -PT OT evaluation by speech  evaluation -Neurology following plan to repeat CT scan tomorrow.  Okay with heparin drip for now. -LDL 56, hemoglobin A1c: 5.9. -Echo: Ejection fraction 65 percent, grade 1 diastolic dysfunction, left atrial enlargement -Carotid doppler:  -Started on Lipitor.  -Avoid Hypotension, monitor on nitroglycerin gtt.   4-Multiple Sclerosis: Patient to follow-up with her outpatient neurologist  Pre- Diabetes type 2: -hb-A1c 5.9 -On Diet.   Chronic Back pain/RLS;  Resume requip   HTN; was not on medications.   Estimated body mass index is 33.83 kg/m as calculated from the following:   Height as of this encounter: 5\' 4"  (1.626 m).   Weight as of this encounter: 89.4 kg.   DVT prophylaxis: Heparin Gtt Code Status: Full code Family Communication: Husband at Bedside.  Disposition Plan:  Status is: Inpatient  Remains inpatient appropriate because:IV treatments appropriate due to intensity of illness or inability to take PO   Dispo: The patient is from: Home              Anticipated d/c is to: Home              Patient currently is not medically stable to d/c.   Difficult to place patient No        Consultants:   Cardiology primary  Neurology/   Procedures:   ECHO;   Antimicrobials:    Subjective: She is alert conversant, denies chest pain.   Objective: Vitals:   09/10/20 0012 09/10/20 0418 09/10/20 0518 09/10/20 0625  BP: (!) 105/50 (!) 125/52 (!) 116/57 122/62  Pulse: 60 64  65  Resp: 14 16    Temp: 98.3 F (36.8 C) 98.1 F (36.7 C)  98.2 F (36.8 C)  TempSrc: Oral Oral  Oral  SpO2: 99% 100%  99%  Weight:      Height:        Intake/Output Summary (Last 24 hours) at 09/10/2020 0719 Last data filed at 09/10/2020 0423 Gross per 24 hour  Intake 720 ml  Output 2300 ml  Net -1580 ml   Filed Weights   09/08/20 1610 09/09/20 0507  Weight: 89.4 kg 89.4 kg    Examination:  General exam: Appears calm and comfortable  Respiratory system: Clear to  auscultation. Respiratory effort normal. Cardiovascular system: S1 & S2 heard, RRR.  Gastrointestinal system: Abdomen is nondistended, soft and nontender.  Central nervous system: Alert speech clear, Upper extremities 5/5, right LE 0/5, Left 4/5 Extremities: trace edema    Data Reviewed: I have personally reviewed following labs and imaging studies  CBC: Recent Labs  Lab 09/09/20 0451 09/10/20 0601  WBC 8.0 6.3  HGB 10.5* 10.8*  HCT 30.2* 31.6*  MCV 97.1 97.2  PLT 214 213   Basic Metabolic Panel: Recent Labs  Lab 09/08/20 1706 09/09/20 0451 09/10/20 0601  NA 137 137 138  K 3.9 3.8 3.5  CL 111 108 105  CO2 22 24 27   GLUCOSE 138* 123* 102*  BUN 13 10 11   CREATININE 0.59 0.51 0.55  CALCIUM 8.2* 8.3* 8.4*  MG  --  1.8 1.9   GFR: Estimated Creatinine Clearance: 68.8 mL/min (by C-G formula based on SCr of 0.55 mg/dL). Liver Function Tests: No results for input(s): AST, ALT, ALKPHOS, BILITOT, PROT, ALBUMIN in the last 168 hours. No results for input(s): LIPASE, AMYLASE in the last 168 hours. No results for input(s): AMMONIA in the last 168 hours. Coagulation Profile: No results for input(s): INR, PROTIME in the last 168 hours. Cardiac Enzymes: No results for input(s): CKTOTAL, CKMB, CKMBINDEX, TROPONINI in the last 168 hours. BNP (last 3 results) No results for input(s): PROBNP in the last 8760 hours. HbA1C: Recent Labs    09/10/20 0601  HGBA1C 5.9*   CBG: No results for input(s): GLUCAP in the last 168 hours. Lipid Profile: No results for input(s): CHOL, HDL, LDLCALC, TRIG, CHOLHDL, LDLDIRECT in the last 72 hours. Thyroid Function Tests: No results for input(s): TSH, T4TOTAL, FREET4, T3FREE, THYROIDAB in the last 72 hours. Anemia Panel: No results for input(s): VITAMINB12, FOLATE, FERRITIN, TIBC, IRON, RETICCTPCT in the last 72 hours. Sepsis Labs: Recent Labs  Lab 09/08/20 1706  LATICACIDVEN 1.5    Recent Results (from the past 240 hour(s))  Culture,  blood (routine x 2)     Status: None (Preliminary result)   Collection Time: 09/08/20  9:13 PM   Specimen: BLOOD  Result Value Ref Range Status   Specimen Description BLOOD SITE NOT SPECIFIED  Final   Special Requests   Final    BOTTLES DRAWN AEROBIC AND ANAEROBIC Blood Culture adequate volume   Culture   Final    NO GROWTH < 24 HOURS Performed at [facility], [address] [place], [place] [address]    Report Status PENDING  Incomplete  Culture, blood (routine x 2)     Status: None (Preliminary result)   Collection Time: 09/08/20  9:20 PM   Specimen: BLOOD  Result Value Ref Range Status   Specimen Description BLOOD SITE NOT SPECIFIED  Final   Special Requests   Final    BOTTLES DRAWN AEROBIC ONLY Blood Culture adequate volume   Culture   Final    NO GROWTH < 24 HOURS Performed at [facility],  [address], [place], [place] [address]    Report Status PENDING  Incomplete         Radiology Studies: CT HEAD WO CONTRAST  Result Date: 09/08/2020 CLINICAL DATA:  Confusion EXAM: CT HEAD WITHOUT CONTRAST TECHNIQUE: Contiguous axial images were obtained from the base of the skull through the vertex without intravenous contrast. COMPARISON:  None. FINDINGS: Brain: No evidence of acute infarction, hemorrhage, hydrocephalus, extra-axial collection or mass lesion/mass effect. Mild-moderate low-density changes within the periventricular and subcortical white matter compatible with chronic microvascular ischemic change. Mild diffuse cerebral volume loss. Vascular: Atherosclerotic calcifications involving the large vessels of the skull base. No unexpected hyperdense vessel. Skull: Normal. Negative for fracture or focal lesion. Sinuses/Orbits: No acute finding. Other: None. IMPRESSION: 1. No acute intracranial findings. 2. 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 Electronically Signed   By: Duanne Guess D.O.   On: 09/08/2020 18:00   MR ANGIO HEAD WO  CONTRAST  Result Date: 09/10/2020 CLINICAL DATA:  75 year old female presenting with confusion. Right cerebellar infarct on brain MRI earlier today. EXAM: MRA HEAD WITHOUT CONTRAST TECHNIQUE: Angiographic images of the Circle of Willis were obtained using MRA technique without intravenous contrast. COMPARISON:  brain MRI 0047 hours today. FINDINGS: No significant intracranial mass effect is evident. No ventriculomegaly. Antegrade flow in the posterior circulation in the distal right vertebral artery appears dominant. There is no distal right vertebral stenosis. Patent vertebrobasilar junction. Right PICA origin does appear to be patent on series 20, image 55. And there is also faint visualization of the right AICA, better than that on the left. Proximal SCA is also appear symmetric and patent. Non dominant left vertebral artery with normal left PICA origin. Patent left vertebrobasilar junction. Patent basilar artery without stenosis. PCA origins are within normal limits. Posterior communicating arteries are diminutive or absent. Tortuous left P1 segment. Bilateral PCA branches are within normal limits. Antegrade flow in both ICA siphons. Normal ophthalmic artery origins. Mild bilateral siphon irregularity but no significant siphon stenosis. Patent carotid termini. Normal MCA and ACA origins. Anterior communicating artery and visible ACA branches are within normal limits. Left MCA M1 bifurcates early without stenosis. Right MCA M1 is patent to the trifurcation. The right MCA trifurcation is ectatic but patent without stenosis. No discrete aneurysm identified. Otherwise the visible bilateral MCA branches are within normal limits. IMPRESSION: 1. Posterior circulation appears patent with no origin or proximal occlusion of right cerebellar arteries. Dominant right vertebral artery. 2. 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 Ectatic right MCA trifurcation. Otherwise negative anterior circulation.  Electronically Signed   By: Odessa Fleming M.D.   On: 09/10/2020 05:07   MR BRAIN W WO CONTRAST  Result Date: 09/10/2020 CLINICAL DATA:  Delirium EXAM: MRI HEAD WITHOUT AND WITH CONTRAST TECHNIQUE: Multiplanar, multiecho pulse sequences of the brain and surrounding structures were obtained without and with intravenous contrast. CONTRAST:  16mL GADAVIST GADOBUTROL 1 MMOL/ML IV SOLN COMPARISON:  None. FINDINGS: Brain: Right PICA territory acute infarct. No acute or chronic hemorrhage. There is multifocal hyperintense T2-weighted signal within the white matter. Parenchymal volume and CSF spaces are normal. The midline structures are normal. There is no abnormal contrast enhancement. Vascular: Major flow voids are preserved. Skull and upper cervical spine: Normal calvarium and skull base. Visualized upper cervical spine and soft tissues are normal. Sinuses/Orbits:No paranasal sinus fluid levels or advanced mucosal thickening. No mastoid or middle ear effusion. Normal orbits. IMPRESSION: 1. Right PICA territory acute infarct. No hemorrhage or mass effect.  2. Findings of chronic microvascular ischemia. Electronically Signed   By: Deatra Robinson M.D.   On: 09/10/2020 02:44   ECHOCARDIOGRAM COMPLETE  Result Date: 09/09/2020    ECHOCARDIOGRAM REPORT   Patient Name:   Caitlin Pierce Date of Exam: 09/09/2020 Medical Rec #:  [id number]   Height:       64.0 in Accession #:    [id number]  Weight:       197.1 lb Date of Birth:  Nov 07, 1945   BSA:          1.944 m Patient Age:    74 years    BP:           111/56 mmHg Patient Gender: F           HR:           71 bpm. Exam Location:  Inpatient Procedure: 2D Echo, Cardiac Doppler and Color Doppler Indications:    NSTEMI  History:        Patient has no prior history of Echocardiogram examinations.                 Acute MI.  Sonographer:    Neomia Dear RDCS Referring Phys: [id number] HEATHER E [place] IMPRESSIONS  1. Left ventricular ejection fraction, by estimation, is 65 to 70%. The left  ventricle has normal function. The left ventricle has no regional wall motion abnormalities. Left ventricular diastolic parameters are consistent with Grade I diastolic dysfunction (impaired relaxation).  2. Right ventricular systolic function is normal. The right ventricular size is normal. There is normal pulmonary artery systolic pressure.  3. Left atrial size was mildly dilated.  4. The mitral valve is normal in structure. No evidence of mitral valve regurgitation. No evidence of mitral stenosis.  5. The aortic valve is tricuspid. Aortic valve regurgitation is not visualized. No aortic stenosis is present.  6. The inferior vena cava is normal in size with greater than 50% respiratory variability, suggesting right atrial pressure of 3 mmHg. FINDINGS  Left Ventricle: Left ventricular ejection fraction, by estimation, is 65 to 70%. The left ventricle has normal function. The left ventricle has no regional wall motion abnormalities. The left ventricular internal cavity size was normal in size. There is  no left ventricular hypertrophy. Left ventricular diastolic parameters are consistent with Grade I diastolic dysfunction (impaired relaxation). Indeterminate filling pressures. Right Ventricle: The right ventricular size is normal. No increase in right ventricular wall thickness. Right ventricular systolic function is normal. There is normal pulmonary artery systolic pressure. The tricuspid regurgitant velocity is 2.43 m/s, and  with an assumed right atrial pressure of 3 mmHg, the estimated right ventricular systolic pressure is 26.6 mmHg. Left Atrium: Left atrial size was mildly dilated. Right Atrium: Right atrial size was normal in size. Pericardium: Trivial pericardial effusion is present. Mitral Valve: The mitral valve is normal in structure. No evidence of mitral valve regurgitation. No evidence of mitral valve stenosis. Tricuspid Valve: The tricuspid valve is normal in structure. Tricuspid valve regurgitation  is mild . No evidence of tricuspid stenosis. Aortic Valve: The aortic valve is tricuspid. Aortic valve regurgitation is not visualized. No aortic stenosis is present. Aortic valve mean gradient measures 4.0 mmHg. Aortic valve peak gradient measures 8.6 mmHg. Aortic valve area, by VTI measures 3.05 cm. Pulmonic Valve: The pulmonic valve was normal in structure. Pulmonic valve regurgitation is trivial. No evidence of pulmonic stenosis. Aorta: The aortic root is normal in size and structure. Venous: The inferior vena cava is  normal in size with greater than 50% respiratory variability, suggesting right atrial pressure of 3 mmHg. IAS/Shunts: No atrial level shunt detected by color flow Doppler.  LEFT VENTRICLE PLAX 2D LVIDd:         4.60 cm     Diastology LVIDs:         2.90 cm     LV e' medial:    6.42 cm/s LV PW:         1.10 cm     LV E/e' medial:  12.3 LV IVS:        0.95 cm     LV e' lateral:   10.70 cm/s LVOT diam:     2.30 cm     LV E/e' lateral: 7.4 LV SV:         91 LV SV Index:   47 LVOT Area:     4.15 cm  LV Volumes (MOD) LV vol d, MOD A2C: 78.3 ml LV vol d, MOD A4C: 71.5 ml LV vol s, MOD A2C: 30.1 ml LV vol s, MOD A4C: 22.2 ml LV SV MOD A2C:     48.2 ml LV SV MOD A4C:     71.5 ml LV SV MOD BP:      48.5 ml LEFT ATRIUM             Index       RIGHT ATRIUM           Index LA diam:        3.70 cm 1.90 cm/m  RA Area:     16.80 cm LA Vol (A2C):   79.6 ml 40.94 ml/m RA Volume:   44.10 ml  22.68 ml/m LA Vol (A4C):   72.0 ml 37.03 ml/m LA Biplane Vol: 80.1 ml 41.20 ml/m  AORTIC VALVE                   PULMONIC VALVE AV Area (Vmax):    3.11 cm    PV Vmax:       1.09 m/s AV Area (Vmean):   3.18 cm    PV Vmean:      73.300 cm/s AV Area (VTI):     3.05 cm    PV VTI:        0.228 m AV Vmax:           147.00 cm/s PV Peak grad:  4.8 mmHg AV Vmean:          93.700 cm/s PV Mean grad:  3.0 mmHg AV VTI:            0.297 m AV Peak Grad:      8.6 mmHg AV Mean Grad:      4.0 mmHg LVOT Vmax:         110.00 cm/s LVOT  Vmean:        71.800 cm/s LVOT VTI:          0.218 m LVOT/AV VTI ratio: 0.73  AORTA Ao Root diam: 3.30 cm Ao Asc diam:  3.10 cm MITRAL VALVE               TRICUSPID VALVE MV Area (PHT): 4.71 cm    TR Peak grad:   23.6 mmHg MV Decel Time: 161 msec    TR Vmax:        243.00 cm/s MV E velocity: 78.80 cm/s MV A velocity: 95.50 cm/s  SHUNTS MV E/A ratio:  0.83        Systemic VTI:  0.22 m                            Systemic Diam: 2.30 cm Chilton Siiffany Violet MD Electronically signed by Chilton Siiffany Marshall MD Signature Date/Time: 09/09/2020/12:44:47 PM    Final         Scheduled Meds: . aspirin EC  81 mg Oral Daily  . atorvastatin  80 mg Oral Daily  . metoprolol succinate  25 mg Oral Daily   Continuous Infusions: . heparin 1,250 Units/hr (09/09/20 1507)  . nitroGLYCERIN 10 mcg/min (09/09/20 1508)     LOS: 2 days    Time spent: 35 minutes.     Alba CoryBelkys A Gusta Marksberry, MD [facility]   If 7PM-7AM, please contact night-coverage [email or web address]  09/10/2020, 7:19 AM

## 2020-09-10 NOTE — Evaluation (Signed)
Occupational Therapy Evaluation Patient Details Name: Caitlin Pierce MRN: [id number] DOB: 28-Sep-1945 Today's Date: 09/10/2020    History of Present Illness Pt is a 75 y.o. female admitted to [facility] 09/08/20 with chest pain and SOB; workup for NSTEMI.  Course complicated by syncopal episode whe using bathroom; urgent transfer to [facility]. Pt with increased somnolence; head CT negative for acute injury. MRI showed R PICA acute infarct without hemorrhage. Plan for cardiac cath 3/14. PMH includes MS (chronic R-side weakness), HTN, RLS, chronic back pain.   Clinical Impression   PTA patient was Mod I with QC and was completing ADLs/IADLs including meal prep and driving without external assist. Patient reports frequent falls with ability to get back up. Husband present at bedside notes that falls often occur when patient has quick change in position (turning) in standing. Patient currently functioning slightly below baseline requiring Min guard grossly for functional mobility and ADL transfers with use of RW. Patient also limited by deficits below and diagnosis above and would benefit from continued acute OT services in prep for safe d/c home. Recommendation for return home with support from husband.     Follow Up Recommendations  No OT follow up    Equipment Recommendations  Other (comment) (Rolling walker)    Recommendations for Other Services       Precautions / Restrictions Precautions Precautions: Fall;Other (comment) Precaution Comments: H/o MS with R-side weakness/numbness (RLE > RUE) Restrictions Weight Bearing Restrictions: No      Mobility Bed Mobility               General bed mobility comments: Patient seated in recliner upon entry.    Transfers Overall transfer level: Needs assistance Equipment used: Rolling walker (2 wheeled) Transfers: Sit to/from Stand Sit to Stand: Min guard         General transfer comment: Min guard from recliner with cues for hand  placement and  safety.    Balance Overall balance assessment: Needs assistance Sitting-balance support: No upper extremity supported;Feet supported Sitting balance-Leahy Scale: Fair Sitting balance - Comments: Unable to don bilateral socks sitting EOB   Standing balance support: Single extremity supported;Bilateral upper extremity supported;During functional activity Standing balance-Leahy Scale: Poor Standing balance comment: Reliant on BUE on RW.                           ADL either performed or assessed with clinical judgement   ADL Overall ADL's : Needs assistance/impaired     Grooming: Min guard;Standing Grooming Details (indicate cue type and reason): 2/3 grooming tasks standing at sink level.             Lower Body Dressing: Minimal assistance;Sit to/from stand Lower Body Dressing Details (indicate cue type and reason): Min A to don footwear without AE. Education provided on acquisition/use of sock-aid. Toilet Transfer: Immunologist Details (indicate cue type and reason): Simulated with transfer to recliner.         Functional mobility during ADLs: Min guard;Rolling walker General ADL Comments: Patient limited by RLE weakness, decreased balance and need for supplemental O2 via [place].     Vision         Perception     Praxis      Pertinent Vitals/Pain Pain Assessment: 0-10 Pain Score: 2  Pain Location: Chest (tightness), back Pain Descriptors / Indicators: Tightness;Discomfort Pain Intervention(s): Limited activity within patient's tolerance;Monitored during session;Repositioned     Hand Dominance Right   Extremity/Trunk  Assessment Upper Extremity Assessment Upper Extremity Assessment: RUE deficits/detail;LUE deficits/detail RUE Deficits / Details: MMT symmetrical bilaterally and grossly 4/5. Reports R sided weakness mostly in RLE. RUE Sensation: decreased light touch RUE Coordination: WNL (Appears WFL during grooming tasks standing at sink  level.) LUE Deficits / Details: MMT symmetrical bilaterally and grossly 4/5. Reports R sided weakness mostly in RLE. LUE Sensation: WNL LUE Coordination: WNL   Lower Extremity Assessment Lower Extremity Assessment: Defer to PT evaluation   Cervical / Trunk Assessment Cervical / Trunk Assessment: Normal   Communication Communication Communication: No difficulties   Cognition Arousal/Alertness: Awake/alert Behavior During Therapy: WFL for tasks assessed/performed Overall Cognitive Status: Within Functional Limits for tasks assessed                                     General Comments  SpO2 95-96% on 2L at rest and with activity.    Exercises     Shoulder Instructions      Home Living Family/patient expects to be discharged to:: Private residence Living Arrangements: Spouse/significant other Available Help at Discharge: Family;Available 24 hours/day Type of Home: House Home Access: Ramped entrance;Other (comment) (has lift on steps in garage; has ramp for back steps)     Home Layout: Two level;Able to live on main level with bedroom/bathroom     Bathroom Shower/Tub: Walk-in shower (lvl entry (no lip))   Bathroom Toilet: Handicapped height Bathroom Accessibility: Yes   Home Equipment: Walker - 2 wheels;Shower seat;Cane - quad;Grab bars - tub/shower          Prior Functioning/Environment Level of Independence: Independent with assistive device(s)        Comments: Mod indep with quad cane; endorses multiple falls, but typically able to get up without assist; has had more trouble the past 1-2 wks (pt attributed it to MS flare); drives; able to perform household tasks        OT Problem List: Decreased strength;Decreased activity tolerance;Impaired balance (sitting and/or standing);Decreased safety awareness;Decreased knowledge of use of DME or AE;Cardiopulmonary status limiting activity      OT Treatment/Interventions: Self-care/ADL  training;Therapeutic exercise;Neuromuscular education;Energy conservation;DME and/or AE instruction;Therapeutic activities;Patient/family education;Balance training    OT Goals(Current goals can be found in the care plan section) Acute Rehab OT Goals Patient Stated Goal: Return home, regain independence OT Goal Formulation: With patient Time For Goal Achievement: 09/24/20 Potential to Achieve Goals: Good ADL Goals Additional ADL Goal #1: Patient will complete A.M. ADLs with Mod I and LRAD with rest breaks and AE as needed. Additional ADL Goal #2: Patient will recall 3 fall prevention strategies in prep ADLs.  OT Frequency: Min 2X/week   Barriers to D/C:            Co-evaluation              AM-PAC OT "6 Clicks" Daily Activity     Outcome Measure Help from another person eating meals?: None Help from another person taking care of personal grooming?: A Little Help from another person toileting, which includes using toliet, bedpan, or urinal?: A Little Help from another person bathing (including washing, rinsing, drying)?: A Little Help from another person to put on and taking off regular upper body clothing?: A Little Help from another person to put on and taking off regular lower body clothing?: A Little 6 Click Score: 19   End of Session Equipment Utilized During Treatment: Gait belt;Rolling walker Nurse  Communication: Mobility status  Activity Tolerance: Patient tolerated treatment well Patient left: in chair;with call bell/phone within reach;with chair alarm set  OT Visit Diagnosis: Unsteadiness on feet (R26.81);Repeated falls (R29.6);Muscle weakness (generalized) (M62.81)                Time: 3291-9166 OT Time Calculation (min): 22 min Charges:  OT General Charges $OT Visit: 1 Visit OT Evaluation $OT Eval Moderate Complexity: 1 Mod  Selig [place] H. Pierce Supplemental OT, Department of rehab services [phone]  Caitlin Pierce R H. 09/10/2020, 1:14 PM

## 2020-09-10 NOTE — Progress Notes (Incomplete Revision)
ANTICOAGULATION CONSULT NOTE  Pharmacy Consult for heparin Indication: chest pain/ACS  No Known Allergies  Patient Measurements: Height: 5\' 4"  (162.6 cm) Weight: 89.4 kg (197 lb 1.5 oz) IBW/kg (Calculated) : 54.7 Heparin Dosing Weight: 81kg (per OSH records)  Vital Signs: Temp: 98.2 F (36.8 C) (03/14 0625) Temp Source: Oral (03/14 0625) BP: 122/62 (03/14 0625) Pulse Rate: 65 (03/14 0625)  Labs: Recent Labs    09/08/20 1706 09/08/20 1821 09/09/20 0451 09/09/20 1325 09/10/20 0601  HGB  --   --  10.5*  --  10.8*  HCT  --   --  30.2*  --  31.6*  PLT  --   --  214  --  213  HEPARINUNFRC  --    < > 0.23* 0.41 0.35  CREATININE 0.59  --  0.51  --  0.55  TROPONINIHS 1,900*  --   --  609*  --    < > = values in this interval not displayed.    Estimated Creatinine Clearance: 68.8 mL/min (by C-G formula based on SCr of 0.55 mg/dL).  Medical History: Past Medical History:  Diagnosis Date  . Chronic back pain   . Essential hypertension   . GERD (gastroesophageal reflux disease)   . MS (multiple sclerosis) (HCC)   . Prediabetes   . RLS (restless legs syndrome)     Assessment: 74 yoF admitted from OSH, pharmacy asked to manage IV heparin for ACS. No AC PTA noted, CBC wnl at OSH 3/11 pm. MRI noted with CVA.  -heparin level at goal and hg stable  Goal of Therapy:  Heparin level 0.3-0.7 units/ml Monitor platelets by anticoagulation protocol: Yes   Plan:  Continue heparin at 1250 units/h   5/11, PharmD Clinical Pharmacist **Pharmacist phone directory can now be found on [email or web address] ([email or web address]).  Listed under [facility].

## 2020-09-10 NOTE — Progress Notes (Signed)
ANTICOAGULATION CONSULT NOTE  Pharmacy Consult for heparin Indication: chest pain/ACS  No Known Allergies  Patient Measurements: Height: 5\' 4"  (162.6 cm) Weight: 89.4 kg (197 lb 1.5 oz) IBW/kg (Calculated) : 54.7 Heparin Dosing Weight: 81kg (per OSH records)  Vital Signs: Temp: 98.2 F (36.8 C) (03/14 0625) Temp Source: Oral (03/14 0625) BP: 122/62 (03/14 0625) Pulse Rate: 65 (03/14 0625)  Labs: Recent Labs    09/08/20 1706 09/08/20 1821 09/09/20 0451 09/09/20 1325 09/10/20 0601  HGB  --   --  10.5*  --  10.8*  HCT  --   --  30.2*  --  31.6*  PLT  --   --  214  --  213  HEPARINUNFRC  --    < > 0.23* 0.41 0.35  CREATININE 0.59  --  0.51  --  0.55  TROPONINIHS 1,900*  --   --  609*  --    < > = values in this interval not displayed.    Estimated Creatinine Clearance: 68.8 mL/min (by C-G formula based on SCr of 0.55 mg/dL).  Medical History: Past Medical History:  Diagnosis Date  . Chronic back pain   . Essential hypertension   . GERD (gastroesophageal reflux disease)   . MS (multiple sclerosis) (HCC)   . Prediabetes   . RLS (restless legs syndrome)     Assessment: 74 yoF admitted from OSH, pharmacy asked to manage IV heparin for ACS. No AC PTA noted, CBC wnl at OSH 3/11 pm. Cardiology planning for cath today  -heparin level at goal and hg stable  Goal of Therapy:  Heparin level 0.3-0.7 units/ml Monitor platelets by anticoagulation protocol: Yes   Plan:  Continue heparin at 1250 units/h Will follow plans post cath  5/11, PharmD Clinical Pharmacist **Pharmacist phone directory can now be found on [email or web address] ([email or web address]).  Listed under [facility].

## 2020-09-10 NOTE — Evaluation (Addendum)
Physical Therapy Evaluation Patient Details Name: Caitlin Pierce MRN: [id number] DOB: 1945-09-09 Today's Date: 09/10/2020   History of Present Illness  Pt is a 75 y.o. female admitted to [facility] 09/08/20 with chest pain and SOB; workup for NSTEMI.  Course complicated by syncopal episode whe using bathroom; urgent transfer to [facility]. Pt with increased somnolence; head CT negative for acute injury. MRI showed R PICA acute infarct without hemorrhage. Plan for cardiac cath 3/14. PMH includes MS (chronic R-side weakness), HTN, RLS, chronic back pain.    Clinical Impression  Pt presents with an overall decrease in functional mobility secondary to above. PTA, pt reports mod indep with quad cane due to chronic R-side weakness/numbness (RLE > RUE) due to MS; pt endorses h/o multiple falls, typically able to get up herself; pt lives with supportive husband who is available to assist as needed. Today, pt required minA for stability taking steps with quad cane; additional transfer and gait trial with RW and min guard. Pt would benefit from continued acute PT services to maximize functional mobility and independence prior to d/c home.  Resting BP 130/66, post-mobility BP 132/60 HR 71-101 SpO2 down to 86% on RA, replaced 2L O2 [place]    Follow Up Recommendations Outpatient PT;Supervision for mobility/OOB    Equipment Recommendations   (TBD on RW - pt may own)    Recommendations for Other Services       Precautions / Restrictions Precautions Precautions: Fall;Other (comment) Precaution Comments: H/o MS with R-side weakness/numbness (RLE > RUE) Restrictions Weight Bearing Restrictions: No      Mobility  Bed Mobility Overal bed mobility: Modified Independent             General bed mobility comments: Use of bed rail, HOB elevated, coming to L-side of bed; pt reports typically goes towards "weak" side    Transfers Overall transfer level: Needs assistance Equipment used: Quad cane;Rolling walker (2  wheeled) Transfers: Sit to/from Stand           General transfer comment: Initial stand from EOB to quad cane with close min guard for balance, increased time to establish BOS but declined LUE support for added stability first time standing; use of RW for subsequent trials from recliner and low toilet height, stability much improved, cues for sequencing/hand placement, min guard for balance  Ambulation/Gait Ambulation/Gait assistance: Min assist;Min guard     Gait Pattern/deviations: Step-to pattern;Decreased weight shift to right;Decreased dorsiflexion - right;Trunk flexed Gait velocity: Decreased   General Gait Details: Initial steps from bed to recliner with quad cane and minA for stability, pt reaching forward for LUE support on recliner armrest; additional ambulation in room with RW, stability improved, min guard for balance; pt with significant RLE weakness, dragging R foot behind unable to take step with RLE (pt reports baseline gait pattern)  Stairs            Wheelchair Mobility    Modified Rankin (Stroke Patients Only) Modified Rankin (Stroke Patients Only) Pre-Morbid Rankin Score: No significant disability Modified Rankin: Moderately severe disability     Balance Overall balance assessment: Needs assistance   Sitting balance-Leahy Scale: Fair Sitting balance - Comments: Unable to don bilateral socks sitting EOB   Standing balance support: Single extremity supported;Bilateral upper extremity supported;During functional activity Standing balance-Leahy Scale: Poor Standing balance comment: Reliant on UE support; stability much improved with BUE support  Pertinent Vitals/Pain Pain Assessment: 0-10 Pain Score: 2  Pain Location: Chest (tightness), back Pain Descriptors / Indicators: Tightness;Discomfort Pain Intervention(s): Monitored during session    Home Living Family/patient expects to be discharged to:: Private  residence Living Arrangements: Spouse/significant other Available Help at Discharge: Family;Available 24 hours/day Type of Home: House Home Access: Ramped entrance;Other (comment) (has lift on steps in garage; has ramp for back steps)     Home Layout: Two level;Able to live on main level with bedroom/bathroom Home Equipment: Dan Humphreys - 2 wheels;Shower seat;Cane - quad;Grab bars - tub/shower      Prior Function Level of Independence: Independent with assistive device(s)         Comments: Mod indep with quad cane; endorses multiple falls, but typically able to get up without assist; has had more trouble the past 1-2 wks (pt attributed it to MS flare); drives; able to perform household tasks     Hand Dominance        Extremity/Trunk Assessment   Upper Extremity Assessment Upper Extremity Assessment: RUE deficits/detail RUE Deficits / Details: Functionally at least 3/5 throughout; endorses decreased light touch; pt reports RUE at baseline; dysmetria with finger-to-nose test (unsure if new vs. chronic) RUE Sensation: decreased light touch    Lower Extremity Assessment Lower Extremity Assessment: RLE deficits/detail RLE Deficits / Details: Functionally </ 2/5 throughout; decreased light touch, can feel pressure; pt reports RLE at basline RLE Sensation: decreased light touch RLE Coordination: decreased fine motor;decreased gross motor       Communication   Communication: No difficulties  Cognition Arousal/Alertness: Awake/alert Behavior During Therapy: WFL for tasks assessed/performed Overall Cognitive Status: Within Functional Limits for tasks assessed                                 General Comments: Becoming tearful sitting EOB when she realized she had trouble getting on her L sock; responded well to encouragement      General Comments General comments (skin integrity, edema, etc.): SpO2 100% on 3L O2; down to 86-88% on RA; replaced 2L. Resting BP 130/66;  post-ambulation BP 132/60. HR 71-101. Pt becoming tearful regarding current situation - increased time discussing and educ re: current condition, DME and assist needs, MS flare/symptoms, upcoming procedures, return to home, fall risk reduction, importance of mobility    Exercises     Assessment/Plan    PT Assessment Patient needs continued PT services  PT Problem List Decreased strength;Decreased activity tolerance;Decreased balance;Decreased mobility;Decreased knowledge of use of DME;Cardiopulmonary status limiting activity       PT Treatment Interventions DME instruction;Gait training;Stair training;Functional mobility training;Therapeutic activities;Therapeutic exercise;Balance training;Patient/family education    PT Goals (Current goals can be found in the Care Plan section)  Acute Rehab PT Goals Patient Stated Goal: Return home, regain independence PT Goal Formulation: With patient Time For Goal Achievement: 09/24/20 Potential to Achieve Goals: Good    Frequency Min 4X/week   Barriers to discharge        Co-evaluation               AM-PAC PT "6 Clicks" Mobility  Outcome Measure Help needed turning from your back to your side while in a flat bed without using bedrails?: A Little Help needed moving from lying on your back to sitting on the side of a flat bed without using bedrails?: A Little Help needed moving to and from a bed to a chair (including a wheelchair)?: A  Little Help needed standing up from a chair using your arms (e.g., wheelchair or bedside chair)?: A Little Help needed to walk in hospital room?: A Little Help needed climbing 3-5 steps with a railing? : A Little 6 Click Score: 18    End of Session   Activity Tolerance: Patient tolerated treatment well Patient left: in chair;with call bell/phone within reach;with chair alarm set Nurse Communication: Mobility status (replaced 2L O2) PT Visit Diagnosis: Other abnormalities of gait and mobility  (R26.89);Muscle weakness (generalized) (M62.81);Other symptoms and signs involving the nervous system (R29.898)    Time: 7017-7939 PT Time Calculation (min) (ACUTE ONLY): 44 min   Charges:   PT Evaluation $PT Eval Moderate Complexity: 1 Mod PT Treatments $Therapeutic Activity: 8-22 mins $Self Care/Home Management: 8-22      Ina Homes, PT, DPT Acute Rehabilitation Services  Pager [phone] Office [phone]  Malachy Chamber 09/10/2020, 10:11 AM

## 2020-09-11 ENCOUNTER — Inpatient Hospital Stay (HOSPITAL_COMMUNITY): Payer: Medicare Other

## 2020-09-11 DIAGNOSIS — I6330 Cerebral infarction due to thrombosis of unspecified cerebral artery: Secondary | ICD-10-CM | POA: Diagnosis not present

## 2020-09-11 DIAGNOSIS — R7303 Prediabetes: Secondary | ICD-10-CM | POA: Diagnosis not present

## 2020-09-11 DIAGNOSIS — K219 Gastro-esophageal reflux disease without esophagitis: Secondary | ICD-10-CM | POA: Diagnosis not present

## 2020-09-11 DIAGNOSIS — I214 Non-ST elevation (NSTEMI) myocardial infarction: Secondary | ICD-10-CM | POA: Diagnosis not present

## 2020-09-11 LAB — BASIC METABOLIC PANEL
Anion gap: 6 (ref 5–15)
BUN: 13 mg/dL (ref 8–23)
CO2: 27 mmol/L (ref 22–32)
Calcium: 8.5 mg/dL — ABNORMAL LOW (ref 8.9–10.3)
Chloride: 104 mmol/L (ref 98–111)
Creatinine, Ser: 0.57 mg/dL (ref 0.44–1.00)
GFR, Estimated: >60 mL/min (ref >60)
Glucose, Bld: 108 mg/dL — ABNORMAL HIGH (ref 70–99)
Potassium: 3.5 mmol/L (ref 3.5–5.1)
Sodium: 137 mmol/L (ref 135–145)

## 2020-09-11 LAB — CBC
HCT: 30.4 % — ABNORMAL LOW (ref 36.0–46.0)
Hemoglobin: 10.4 g/dL — ABNORMAL LOW (ref 12.0–15.0)
MCH: 32.8 pg (ref 26.0–34.0)
MCHC: 34.2 g/dL (ref 30.0–36.0)
MCV: 95.9 fL (ref 80.0–100.0)
Platelets: 207 10*3/uL (ref 150–400)
RBC: 3.17 MIL/uL — ABNORMAL LOW (ref 3.87–5.11)
RDW: 18.2 % — ABNORMAL HIGH (ref 11.5–15.5)
WBC: 6.1 10*3/uL (ref 4.0–10.5)
nRBC: 0 % (ref 0.0–0.2)

## 2020-09-11 LAB — HEPARIN LEVEL (UNFRACTIONATED)
Heparin Unfractionated: 0.21 IU/mL — ABNORMAL LOW (ref 0.30–0.70)
Heparin Unfractionated: 0.35 IU/mL (ref 0.30–0.70)

## 2020-09-11 LAB — MAGNESIUM: Magnesium: 1.9 mg/dL (ref 1.7–2.4)

## 2020-09-11 MED ORDER — ISOSORBIDE MONONITRATE ER 30 MG PO TB24
30.0000 mg | ORAL_TABLET | Freq: Every day | ORAL | Status: DC
  Administered 2020-09-11 – 2020-09-12 (×2): 30 mg via ORAL
  Filled 2020-09-11 (×2): qty 1

## 2020-09-11 MED ORDER — METOPROLOL TARTRATE 50 MG PO TABS: 50.0000 mg | ORAL_TABLET | Freq: Once | ORAL | Status: DC

## 2020-09-11 MED ORDER — METOPROLOL TARTRATE 50 MG PO TABS
50.0000 mg | ORAL_TABLET | Freq: Once | ORAL | Status: AC
  Administered 2020-09-12: 50 mg via ORAL
  Filled 2020-09-11: qty 1

## 2020-09-11 NOTE — Progress Notes (Signed)
ANTICOAGULATION CONSULT NOTE Pharmacy Consult for heparin Indication: chest pain/ACS  No Known Allergies  Patient Measurements: Height: 5\' 4"  (162.6 cm) Weight: 89.4 kg (197 lb 1.5 oz) IBW/kg (Calculated) : 54.7 Heparin Dosing Weight: 81kg (per OSH records)  Vital Signs: Temp: 98.6 F (37 C) (03/15 0005) Temp Source: Oral (03/15 0005) BP: 105/49 (03/15 0005) Pulse Rate: 65 (03/15 0005)  Labs: Recent Labs    09/08/20 1706 09/08/20 1821 09/09/20 0451 09/09/20 1325 09/10/20 0601 09/11/20 0225  HGB  --    < > 10.5*  --  10.8* 10.4*  HCT  --   --  30.2*  --  31.6* 30.4*  PLT  --   --  214  --  213 207  HEPARINUNFRC  --    < > 0.23* 0.41 0.35 0.21*  CREATININE 0.59  --  0.51  --  0.55 0.57  TROPONINIHS 1,900*  --   --  609*  --   --    < > = values in this interval not displayed.    Estimated Creatinine Clearance: 68.8 mL/min (by C-G formula based on SCr of 0.57 mg/dL).  Assessment: 75 y.o. female with NSTEMI and CVA for heparin  Goal of Therapy:  Heparin level 0.3-0.5 units/mL Monitor platelets by anticoagulation protocol: Yes   Plan:  Increase Heparin 1400 units/hr Check heparin level in 8 hours.  61, PharmD, BCPS

## 2020-09-11 NOTE — Progress Notes (Signed)
STROKE TEAM PROGRESS NOTE   INTERVAL HISTORY Her husband is at the bedside. . Patient is sitting in the bedside chair.  She denies any chest discomfort or pain.  Cardiology team planning to do a coronary CT scan tomorrow and then make decision about cardiac cath and revascularization after that.  She remains on IV heparin.  Neurological exam is unchanged.  Vitals:   09/11/20 0527 09/11/20 0830 09/11/20 1204 09/11/20 1256  BP: (!) 117/53 (!) 113/50 (!) 116/47   Pulse: 71 71 66 66  Resp: 16 15 16    Temp: 98.1 F (36.7 C) 98.1 F (36.7 C) 98.2 F (36.8 C)   TempSrc: Oral Oral Oral   SpO2: 98% 97% 92%   Weight: 87.7 kg     Height:       CBC:  Recent Labs  Lab 09/10/20 0601 09/11/20 0225  WBC 6.3 6.1  HGB 10.8* 10.4*  HCT 31.6* 30.4*  MCV 97.2 95.9  PLT 213 207   Basic Metabolic Panel:  Recent Labs  Lab 09/10/20 0601 09/11/20 0225  NA 138 137  K 3.5 3.5  CL 105 104  CO2 27 27  GLUCOSE 102* 108*  BUN 11 13  CREATININE 0.55 0.57  CALCIUM 8.4* 8.5*  MG 1.9 1.9   Lipid Panel:  Recent Labs  Lab 09/10/20 0601  CHOL 133  TRIG 99  HDL 57  CHOLHDL 2.3  VLDL 20  LDLCALC 56   HgbA1c:  Recent Labs  Lab 09/10/20 0601  HGBA1C 5.9*   Urine Drug Screen: No results for input(s): LABOPIA, COCAINSCRNUR, LABBENZ, AMPHETMU, THCU, LABBARB in the last 168 hours.  Alcohol Level No results for input(s): ETH in the last 168 hours.  IMAGING past 24 hours  MR ANGIO HEAD WO CONTRAST Result Date: 09/10/2020 IMPRESSION:  1. Posterior circulation appears patent with no origin or proximal occlusion of right cerebellar arteries. Dominant right vertebral artery. 2. 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 Ectatic right MCA trifurcation.  MR BRAIN W WO CONTRAST Result Date: 09/10/2020 IMPRESSION:  1. Right PICA territory acute infarct. No hemorrhage or mass effect. 2. Findings of chronic microvascular ischemia.   09/09/2020 TTE: 1. Left ventricular ejection  fraction, by estimation, is 65 to 70%. The  left ventricle has normal function. The left ventricle has no regional  wall motion abnormalities. Left ventricular diastolic parameters are  consistent with Grade I diastolic dysfunction (impaired relaxation).  2. Right ventricular systolic function is normal. The right ventricular  size is normal. There is normal pulmonary artery systolic pressure.  3. Left atrial size was mildly dilated.  4. The mitral valve is normal in structure. No evidence of mitral valve  regurgitation. No evidence of mitral stenosis.  5. The aortic valve is tricuspid. Aortic valve regurgitation is not  visualized. No aortic stenosis is present.  6. The inferior vena cava is normal in size with greater than 50%  respiratory variability, suggesting right atrial pressure of 3 mmHg.   PHYSICAL EXAM  Obese elderly Caucasian lady sitting comfortably in bed not in distress. HEENT-  [place]/AT   Lungs- Respirations unlabored Extremities- No edema  Neurological Examination Mental Status: Awake and alert, fully oriented, thought content appropriate.  Speech fluent without evidence of aphasia.  Able to follow all commands without difficulty. Cranial Nerves: II: Temporal visual fields intact with no extinction to DSS. PERRL.   III,IV, VI: No ptosis. EOMI. Sustained nystagmus is noted on end gaze to the right.  Mild saccadic dysmetria on right gaze.  V,VII: Smile symmetric, facial temp sensation equal bilaterally VIII: hearing intact to conversation IX,X: No hypophonia XI: Symmetric XII: midline tongue extension Motor: RUE 5/5 proximally and distally LUE 5/5 proximally and distally RLE 2/5 hip flexion, 3/5 knee extension 1/5 proximal (weakness is chronic) LLE 5/5 proximally and distally Sensory: Decreased temp and FT sensation to RLE Cerebellar: No ataxia with FNF bilaterally on repeated trials.  Gait: Unable to assess due to RLE weakness  ASSESSMENT/PLAN [name]  is a 75 y.o. female with history of MS, HTN, prediabetes and restless leg syndrome who initially presented to [facility] with chest tightness. She was diagnosed with NSTEMI. She had a syncopal episode there and subsequently was noted to be very somnolent and only mildly responsive. A STAT head CT was negative. She was transferred to [facility].  An MRI was obtained due to continued somnolence. The MRI scan revealed an acute right PICA territory ischemic infarction.  Stroke:  right PICA territory acute infarct likely secondary to cardioembolic given NSTEMI  CT head: No acute intracranial findings. 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  MRI brain: Right PICA territory acute infarct. Chronic microvascular ischemia.  MRA head: Non dominant left vertebral artery with normal left PICA origin. PCA origins are within normal limits. Posterior       communicating arteries are diminutive or absent. Tortuous left P1       segment. Bilateral PCA branches are within normal limits.  CT head in the am  Carotid Doppler  pending  2D Echo: EF 65-70%, grade 1 diastolic dysfunction, left atrial size mildly dilated  LDL 56  HgbA1c 5.9  VTE prophylaxis - heparin infusion for MI NPO for possible LHC  No antithrombotic prior to admission, now on aspirin 81 mg daily. Currently on Heparin infusion for NSTEMI  Therapy recommendations:  pending  Disposition:  pending  Multiple Sclerosis  Treated at [facility]  Previously on multiple MS medications (5 years ago) but unable to tolerate side effects.  Encouraged patient to consider consulting with primary neurologist for newer MS treatment regimens as the patient noticed left lower extremity weakness 2 weeks ago likely due to MS  NSTEMI  Presented with chest pain at OSH  Hs troponin elevated at 1,900 -> 609  Heparin Infusion for MI (outweigh the risks of hemorrhagic conversion of the right PICA stroke)   If left heart  cath is necessary for treatment of current MI, overall benefit will outweigh the risk of hemorrhagic conversion of the stroke from a neurological standpoint   Aspirin 81mg , metoprolol 25mg  daily  Hypertension  Home meds: none  Stable . Permissive hypertension (OK if < 220/120) but gradually normalize in 5-7 days . Long-term BP goal normotensive  Hyperlipidemia  Home meds:  none  LDL 56, goal < 70  Add atorvastatin 80mg  daily  Continue statin at discharge  Diabetes type II Controlled  Home meds:  none  HgbA1c 5.9, goal < 7.0  CBGs  SSI  Other Stroke Risk Factors  Advanced Age >/= 19   Remote Cigarette smoker quit 12 years ago  ETOH use, no alcohol level available.  Drinks 1-2 glasses of wine per week.  Obesity, Body mass index is 33.19 kg/m., BMI >/= 30 associated with increased stroke risk, recommend weight loss, diet and exercise as appropriate   Coronary artery disease, recent MI,   Other Active Problems  MS  GERD  Hospital day # 3   Patient neurological exam is stable.  Continue IV  heparin short-term.  But may need to change to dual antiplatelet therapy at discharge and decision of whether aspirin and Plavix or aspirin and Brilinta unknown the cardiologist preference .patient will also need cardiac catheterization and revascularization if necessary with angioplasty stenting.  There is a slight risk of hemorrhagic transformation of her acute infarct but given the risk-benefit may be worth the risk.  If cardiology feels it is no long-term indication for anticoagulation purely based on her NSTEMI  may consider doing a loop recorder at discharge to look for paroxysmal A. fib. . Continue aggressive risk factor modification.  Greater than 50% time during this 25-minute visit was spent on counseling and coordination of care about her embolic stroke discussion about evaluation and treatment and answering questions.  Discussed with patient, husband and  Dr.Regalardo  Delia Heady, MD Medical Director [facility]  09/11/2020 1:41 PM  To contact Stroke Continuity provider, please refer to [email or web address]. After hours, contact General Neurology

## 2020-09-11 NOTE — Progress Notes (Addendum)
PROGRESS NOTE    Caitlin Pierce  IWL:[id number] DOB: 10-11-1945 DOA: 09/08/2020 PCP: Pcp, No   Brief Narrative: 75 year old past medical history significant for hypertension, MS, chronic back pain, RLS who presents on 3/12 to OSH with chest pain.  Patient was found to have non-STEMI and was transferred to [facility] for heart cath.  On arrival to [facility] patient was noted to have altered mental status after she received at prior hospital Phenergan and Zofran.  After further  evaluation for AMS  patient was found to have acute stroke on right PICA territory.  -Neurology has been consulted and has cleared patient to have heart cath.  Patient to continue with heparin drip for non-STEMI, and discontinue heparin on 3/16. Repeated CT head stable. Plan to proceed with CT coronary tomorrow, depending on result cardiology will decide if they want to proceed with cath this admission.    Assessment & Plan:   Active Problems:   NSTEMI (non-ST elevated myocardial infarction) (HCC)   MS (multiple sclerosis) (HCC)   GERD (gastroesophageal reflux disease)   Essential hypertension   Chronic back pain   Prediabetes   RLS (restless legs syndrome)   Cerebral thrombosis with cerebral infarction  1-non-STEMI: -Patient presented to [facility] with chest pain, elevation of troponin changes and EKG consistent with non-STEMI.  Troponin 609. -Patient was admitted by cardiology, transfer to [facility] for heart cath. -On heparin drip. Nitroglycerin Gtt. Plan to transition to imdur tomorrow, and discontinue heparin gtt tomorrow.  -Started on Lipitor, Metoprolol, aspirin.   2-Acute metabolic encephalopathy: Could be related to acute stroke versus medication -Patient currently back to baseline -Questionable syncope prior hospital.  Monitor on telemetry  3-Acute PICA  Territory infarct; -RDE:YCXKG PICA territory acute infarct. No hemorrhage or mass effect. -YJE:HUDJSHFWY circulation appears patent with no origin or  proximal occlusion of right cerebellar arteries. Dominant right vertebral artery. 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 Ectatic right MCA trifurcation. Otherwise negative anterior circulation. -PT OT evaluation by speech evaluation -Neurology following plan to repeat CT scan tomorrow.  Okay with heparin drip for now. -LDL 56, hemoglobin A1c: 5.9. -Echo: Ejection fraction 65 percent, grade 1 diastolic dysfunction, left atrial enlargement -Carotid doppler: minimal Plaque.  -Started on Lipitor.  -Avoid Hypotension, monitor on nitroglycerin gtt.  -Neurology recommend aspirin plus plavix for discharge, might need to start plavix tomorrow after heparin is discontinue. Might need Loop recorder to look for a fib.   4-Multiple Sclerosis: Patient to follow-up with her outpatient neurologist  Pre- Diabetes type 2: -hb-A1c 5.9 -On Diet.   Chronic Back pain/RLS;  Resume requip   HTN; was not on medications.   [facility] will take over patient care.   Estimated body mass index is 33.19 kg/m as calculated from the following:   Height as of this encounter: 5\' 4"  (1.626 m).   Weight as of this encounter: 87.7 kg.   DVT prophylaxis: Heparin Gtt Code Status: Full code Family Communication: Husband at Bedside 3-14.  Disposition Plan:  Status is: Inpatient  Remains inpatient appropriate because:IV treatments appropriate due to intensity of illness or inability to take PO   Dispo: The patient is from: Home              Anticipated d/c is to: Home              Patient currently is not medically stable to d/c.   Difficult to place patient No        Consultants:   Cardiology primary  Neurology/   Procedures:   ECHO;   Antimicrobials:    Subjective: She was able to ambulate with PT on the hall.  She denies chest pain, she is feeling better, alert and conversant.    Objective: Vitals:   09/11/20 0527 09/11/20 0830 09/11/20 1204 09/11/20 1256  BP: (!) 117/53  (!) 113/50 (!) 116/47   Pulse: 71 71 66 66  Resp: 16 15 16    Temp: 98.1 F (36.7 C) 98.1 F (36.7 C) 98.2 F (36.8 C)   TempSrc: Oral Oral Oral   SpO2: 98% 97% 92%   Weight: 87.7 kg     Height:        Intake/Output Summary (Last 24 hours) at 09/11/2020 1817 Last data filed at 09/10/2020 1950 Gross per 24 hour  Intake 240 ml  Output -  Net 240 ml   Filed Weights   09/08/20 1610 09/09/20 0507 09/11/20 0527  Weight: 89.4 kg 89.4 kg 87.7 kg    Examination:  General exam: NAD Respiratory system: CTA Cardiovascular system: S 1, S 2 RRR Gastrointestinal system: BS present, soft, nt Central nervous system: Alert speech clear, Upper extremities 5/5, right LE 0/5, Left 4/5 Extremities: trace edema    Data Reviewed: I have personally reviewed following labs and imaging studies  CBC: Recent Labs  Lab 09/09/20 0451 09/10/20 0601 09/11/20 0225  WBC 8.0 6.3 6.1  HGB 10.5* 10.8* 10.4*  HCT 30.2* 31.6* 30.4*  MCV 97.1 97.2 95.9  PLT 214 213 207   Basic Metabolic Panel: Recent Labs  Lab 09/08/20 1706 09/09/20 0451 09/10/20 0601 09/11/20 0225  NA 137 137 138 137  K 3.9 3.8 3.5 3.5  CL 111 108 105 104  CO2 22 24 27 27   GLUCOSE 138* 123* 102* 108*  BUN 13 10 11 13   CREATININE 0.59 0.51 0.55 0.57  CALCIUM 8.2* 8.3* 8.4* 8.5*  MG  --  1.8 1.9 1.9   GFR: Estimated Creatinine Clearance: 68.1 mL/min (by C-G formula based on SCr of 0.57 mg/dL). Liver Function Tests: No results for input(s): AST, ALT, ALKPHOS, BILITOT, PROT, ALBUMIN in the last 168 hours. No results for input(s): LIPASE, AMYLASE in the last 168 hours. No results for input(s): AMMONIA in the last 168 hours. Coagulation Profile: No results for input(s): INR, PROTIME in the last 168 hours. Cardiac Enzymes: No results for input(s): CKTOTAL, CKMB, CKMBINDEX, TROPONINI in the last 168 hours. BNP (last 3 results) No results for input(s): PROBNP in the last 8760 hours. HbA1C: Recent Labs    09/10/20 0601   HGBA1C 5.9*   CBG: No results for input(s): GLUCAP in the last 168 hours. Lipid Profile: Recent Labs    09/10/20 0601  CHOL 133  HDL 57  LDLCALC 56  TRIG 99  CHOLHDL 2.3   Thyroid Function Tests: No results for input(s): TSH, T4TOTAL, FREET4, T3FREE, THYROIDAB in the last 72 hours. Anemia Panel: No results for input(s): VITAMINB12, FOLATE, FERRITIN, TIBC, IRON, RETICCTPCT in the last 72 hours. Sepsis Labs: Recent Labs  Lab 09/08/20 1706  LATICACIDVEN 1.5    Recent Results (from the past 240 hour(s))  Culture, Urine     Status: Abnormal   Collection Time: 09/08/20  1:47 AM   Specimen: Urine, Random  Result Value Ref Range Status   Specimen Description URINE, RANDOM  Final   Special Requests   Final    NONE Performed at [facility] [place] [facility], [address], [place], [facility] [address]    Culture MULTIPLE SPECIES  PRESENT, SUGGEST RECOLLECTION (A)  Final   Report Status 09/10/2020 FINAL  Final  Culture, blood (routine x 2)     Status: None (Preliminary result)   Collection Time: 09/08/20  9:13 PM   Specimen: BLOOD  Result Value Ref Range Status   Specimen Description BLOOD SITE NOT SPECIFIED  Final   Special Requests   Final    BOTTLES DRAWN AEROBIC AND ANAEROBIC Blood Culture adequate volume   Culture   Final    NO GROWTH 3 DAYS Performed at [facility], [address], [place], [place] [address]    Report Status PENDING  Incomplete  Culture, blood (routine x 2)     Status: None (Preliminary result)   Collection Time: 09/08/20  9:20 PM   Specimen: BLOOD  Result Value Ref Range Status   Specimen Description BLOOD SITE NOT SPECIFIED  Final   Special Requests   Final    BOTTLES DRAWN AEROBIC ONLY Blood Culture adequate volume   Culture   Final    NO GROWTH 3 DAYS Performed at [facility], [address] [place], [place] [address]    Report Status PENDING  Incomplete         Radiology Studies: CT HEAD WO CONTRAST  Result Date:  09/11/2020 CLINICAL DATA:  Follow-up examination for acute stroke. EXAM: CT HEAD WITHOUT CONTRAST TECHNIQUE: Contiguous axial images were obtained from the base of the skull through the vertex without intravenous contrast. COMPARISON:  Prior MRI from 09/10/2020 FINDINGS: Brain: Continued interval evolution of right PICA territory infarct, stable in size and distribution from previous. No evidence for hemorrhagic transformation, significant regional mass effect, or other complication. No other new acute intracranial infarct or hemorrhage. 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 No midline shift or mass effect. No hydrocephalus or extra-axial fluid collection. Vascular: No hyperdense vessel. Skull: Scalp soft tissues and calvarium demonstrate no acute finding. Sinuses/Orbits: Globes and orbital soft tissues within normal limits. Paranasal sinuses and mastoid air cells remain clear. Other: None. IMPRESSION: 1. Continued interval evolution of right PICA territory infarct, stable in size and distribution from previous. No evidence for hemorrhagic transformation, significant regional mass effect, or other complication. 2. No other new acute intracranial abnormality. Electronically Signed   By: Rise Mu M.D.   On: 09/11/2020 03:14   MR ANGIO HEAD WO CONTRAST  Result Date: 09/10/2020 CLINICAL DATA:  75 year old female presenting with confusion. Right cerebellar infarct on brain MRI earlier today. EXAM: MRA HEAD WITHOUT CONTRAST TECHNIQUE: Angiographic images of the Circle of Willis were obtained using MRA technique without intravenous contrast. COMPARISON:  brain MRI 0047 hours today. FINDINGS: No significant intracranial mass effect is evident. No ventriculomegaly. Antegrade flow in the posterior circulation in the distal right vertebral artery appears dominant. There is no distal right vertebral stenosis. Patent vertebrobasilar junction. Right PICA origin does appear to be patent  on series 20, image 55. And there is also faint visualization of the right AICA, better than that on the left. Proximal SCA is also appear symmetric and patent. Non dominant left vertebral artery with normal left PICA origin. Patent left vertebrobasilar junction. Patent basilar artery without stenosis. PCA origins are within normal limits. Posterior communicating arteries are diminutive or absent. Tortuous left P1 segment. Bilateral PCA branches are within normal limits. Antegrade flow in both ICA siphons. Normal ophthalmic artery origins. Mild bilateral siphon irregularity but no significant siphon stenosis. Patent carotid termini. Normal MCA and ACA origins. Anterior communicating artery and  visible ACA branches are within normal limits. Left MCA M1 bifurcates early without stenosis. Right MCA M1 is patent to the trifurcation. The right MCA trifurcation is ectatic but patent without stenosis. No discrete aneurysm identified. Otherwise the visible bilateral MCA branches are within normal limits. IMPRESSION: 1. Posterior circulation appears patent with no origin or proximal occlusion of right cerebellar arteries. Dominant right vertebral artery. 2. 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 Ectatic right MCA trifurcation. Otherwise negative anterior circulation. Electronically Signed   By: Odessa FlemingH  Hall M.D.   On: 09/10/2020 05:07   MR BRAIN W WO CONTRAST  Result Date: 09/10/2020 CLINICAL DATA:  Delirium EXAM: MRI HEAD WITHOUT AND WITH CONTRAST TECHNIQUE: Multiplanar, multiecho pulse sequences of the brain and surrounding structures were obtained without and with intravenous contrast. CONTRAST:  9mL GADAVIST GADOBUTROL 1 MMOL/ML IV SOLN COMPARISON:  None. FINDINGS: Brain: Right PICA territory acute infarct. No acute or chronic hemorrhage. There is multifocal hyperintense T2-weighted signal within the white matter. Parenchymal volume and CSF spaces are normal. The midline structures are normal. There is  no abnormal contrast enhancement. Vascular: Major flow voids are preserved. Skull and upper cervical spine: Normal calvarium and skull base. Visualized upper cervical spine and soft tissues are normal. Sinuses/Orbits:No paranasal sinus fluid levels or advanced mucosal thickening. No mastoid or middle ear effusion. Normal orbits. IMPRESSION: 1. Right PICA territory acute infarct. No hemorrhage or mass effect. 2. Findings of chronic microvascular ischemia. Electronically Signed   By: Deatra RobinsonKevin  Herman M.D.   On: 09/10/2020 02:44   V[facility]ID ([facility] and [facility] only)  Result Date: 09/11/2020 Carotid Arterial Duplex Study Indications:       CVA. Risk Factors:      Hypertension, MS, pre-diabetes, past history of smoking. Comparison Study:  No prior studies. Performing Technologist: Ernestene MentionJody Hill RVT, RDMS  Examination Guidelines: A complete evaluation includes B-mode imaging, spectral Doppler, color Doppler, and power Doppler as needed of all accessible portions of each vessel. Bilateral testing is considered an integral part of a complete examination. Limited examinations for reoccurring indications may be performed as noted.  Right Carotid Findings: +----------+--------+--------+--------+------------------+------------------+           PSV cm/sEDV cm/sStenosisPlaque DescriptionComments           +----------+--------+--------+--------+------------------+------------------+ CCA Prox  88      20                                                   +----------+--------+--------+--------+------------------+------------------+ CCA Distal76      16                                intimal thickening +----------+--------+--------+--------+------------------+------------------+ ICA Prox  81      26                                intimal thickening +----------+--------+--------+--------+------------------+------------------+ ICA Distal98      28                                                    +----------+--------+--------+--------+------------------+------------------+ ECA  178     0                                                    +----------+--------+--------+--------+------------------+------------------+ +----------+--------+-------+----------------+-------------------+           PSV cm/sEDV cmsDescribe        Arm Pressure (mmHG) +----------+--------+-------+----------------+-------------------+ Subclavian118     2      Multiphasic, WNL                    +----------+--------+-------+----------------+-------------------+ +---------+--------+--+--------+--+---------+ VertebralPSV cm/s53EDV cm/s20Antegrade +---------+--------+--+--------+--+---------+  Left Carotid Findings: +----------+-------+-------+--------+------------------------+-----------------+           PSV    EDV    StenosisPlaque Description      Comments                    cm/s   cm/s                                                     +----------+-------+-------+--------+------------------------+-----------------+ CCA Prox  101    19                                                       +----------+-------+-------+--------+------------------------+-----------------+ CCA Distal75     16                                     intimal                                                                   thickening        +----------+-------+-------+--------+------------------------+-----------------+ ICA Prox  93     30             focal, calcific and     intimal                                           smooth                  thickening        +----------+-------+-------+--------+------------------------+-----------------+ ICA Distal96     29                                                       +----------+-------+-------+--------+------------------------+-----------------+ ECA       151    1                                                         +----------+-------+-------+--------+------------------------+-----------------+ +----------+--------+--------+----------------+-------------------+  PSV cm/sEDV cm/sDescribe        Arm Pressure (mmHG) +----------+--------+--------+----------------+-------------------+ [id number]      0       Multiphasic, WNL                    +----------+--------+--------+----------------+-------------------+ +---------+--------+--+--------+-+---------+ VertebralPSV cm/s38EDV cm/s9Antegrade +---------+--------+--+--------+-+---------+   Summary: Right Carotid: The extracranial vessels were near-normal with only minimal wall                thickening or plaque. Left Carotid: The extracranial vessels were near-normal with only minimal wall               thickening or plaque. Vertebrals:  Bilateral vertebral arteries demonstrate antegrade flow. Subclavians: Normal flow hemodynamics were seen in bilateral subclavian              arteries. *See table(s) above for measurements and observations.  Electronically signed by Delia Heady MD on 09/11/2020 at 12:49:50 PM.    Final         Scheduled Meds: . aspirin EC  81 mg Oral Daily  . atorvastatin  80 mg Oral Daily  . isosorbide mononitrate  30 mg Oral Daily  . metoprolol succinate  25 mg Oral Daily  . metoprolol tartrate  50 mg Oral Once   Continuous Infusions: . heparin 1,400 Units/hr (09/11/20 1141)     LOS: 3 days    Time spent: 35 minutes.     Alba Cory, MD [facility]   If 7PM-7AM, please contact night-coverage [email or web address]  09/11/2020, 6:17 PM

## 2020-09-11 NOTE — Progress Notes (Signed)
Physical Therapy Treatment Patient Details Name: Caitlin Pierce MRN: [id number] DOB: 1946/03/11 Today's Date: 09/11/2020    History of Present Illness Pt is a 75 y.o. female admitted to [facility] 09/08/20 with chest pain and SOB; workup for NSTEMI.  Course complicated by syncopal episode whe using bathroom; urgent transfer to [facility]. Pt with increased somnolence; head CT negative for acute injury. MRI showed R PICA acute infarct without hemorrhage. Carotid duplex 3/14 with near-normal vessels. PMH includes MS (chronic R-side weakness), HTN, RLS, chronic back pain.   PT Comments    Pt progressing well with mobility. Able to increase ambulation distance with RW at supervision-level; demonstrates improving stability and RLE function. Discussed recommendation for RW use at home, pt currently declining; will progress gait training with pt's quad cane. Pt hopeful for d/c home soon; will have necessary support from husband.  SpO2 88-92% on RA with activity   Follow Up Recommendations  Outpatient PT;Supervision for mobility/OOB     Equipment Recommendations   (declined RW)    Recommendations for Other Services       Precautions / Restrictions Precautions Precautions: Fall;Other (comment) Precaution Comments: H/o MS with R-side weakness/numbness (RLE > RUE) Restrictions Weight Bearing Restrictions: No    Mobility  Bed Mobility Overal bed mobility: Modified Independent                  Transfers Overall transfer level: Needs assistance Equipment used: Rolling walker (2 wheeled) Transfers: Sit to/from Stand Sit to Stand: Supervision         General transfer comment: Pt requesting use of RW; able to stand from EOB and low toilet height without assist, supervision for safety/balance  Ambulation/Gait Ambulation/Gait assistance: Supervision Gait Distance (Feet): 350 Feet Assistive device: Rolling walker (2 wheeled) Gait Pattern/deviations: Step-to pattern;Decreased weight shift to  right;Decreased dorsiflexion - right;Trunk flexed Gait velocity: Decreased   General Gait Details: Improve RLE movement and stability noted this session; pt ambulatory with RW and supervision for safety; pt drags R foot with circumduction-type pattern to walk; pt declined additional gait trial with quad cane   Stairs             Wheelchair Mobility    Modified Rankin (Stroke Patients Only) Modified Rankin (Stroke Patients Only) Pre-Morbid Rankin Score: No significant disability Modified Rankin: Moderate disability     Balance Overall balance assessment: Needs assistance Sitting-balance support: No upper extremity supported;Feet supported Sitting balance-Leahy Scale: Good Sitting balance - Comments: Able to don socks sitting EOB, indep to perform pericare sitting on toilet   Standing balance support: Single extremity supported;Bilateral upper extremity supported;During functional activity Standing balance-Leahy Scale: Fair Standing balance comment: Can briefly stand without UE support to wash hands at sink; stability improved with either trunk leaning against sink or UE support                            Cognition Arousal/Alertness: Awake/alert Behavior During Therapy: WFL for tasks assessed/performed Overall Cognitive Status: Within Functional Limits for tasks assessed                                        Exercises      General Comments General comments (skin integrity, edema, etc.): SpO2 88-92% on RA. Pt requesting use of RW this session, declined gait trial with her quad cane; when asked, pt declines RW for home  use, reports she will just use her quad cane; educ on need to practice with quad cane to ensure safe ambulation      Pertinent Vitals/Pain Pain Assessment: No/denies pain Pain Intervention(s): Monitored during session    Home Living                      Prior Function            PT Goals (current goals can now  be found in the care plan section) Progress towards PT goals: Progressing toward goals    Frequency    Min 4X/week      PT Plan Current plan remains appropriate    Co-evaluation              AM-PAC PT "6 Clicks" Mobility   Outcome Measure  Help needed turning from your back to your side while in a flat bed without using bedrails?: None Help needed moving from lying on your back to sitting on the side of a flat bed without using bedrails?: None Help needed moving to and from a bed to a chair (including a wheelchair)?: A Little Help needed standing up from a chair using your arms (e.g., wheelchair or bedside chair)?: A Little Help needed to walk in hospital room?: A Little Help needed climbing 3-5 steps with a railing? : A Little 6 Click Score: 20    End of Session   Activity Tolerance: Patient tolerated treatment well Patient left: in chair;with call bell/phone within reach;with chair alarm set Nurse Communication: Mobility status PT Visit Diagnosis: Other abnormalities of gait and mobility (R26.89);Muscle weakness (generalized) (M62.81);Other symptoms and signs involving the nervous system (R29.898)     Time: 3382-5053 PT Time Calculation (min) (ACUTE ONLY): 20 min  Charges:  $Gait Training: 8-22 mins                     Ina Homes, PT, DPT Acute Rehabilitation Services  Pager [phone] Office [phone]  Malachy Chamber 09/11/2020, 12:54 PM

## 2020-09-11 NOTE — Progress Notes (Signed)
CARDIAC REHAB PHASE I   Third attempt to see pt this afternoon, in bed after IV. SaO2 86 RA getting to bed and replaced 2L O2. 93 2L. Discussed MI, restrictions, meds, diet, NTG and CRPII. Pt receptive. Will continue discussion tomorrow and also ambulate. [id number]  Harriet Masson CES, ACSM 09/11/2020 3:16 PM

## 2020-09-11 NOTE — Evaluation (Signed)
Speech Language Pathology Evaluation Patient Details Name: Caitlin Pierce MRN: [id number] DOB: [birth year] Today's Date: 09/11/2020 Time: 0102-7253 SLP Time Calculation (min) (ACUTE ONLY): 17 min  Problem List:  Patient Active Problem List   Diagnosis Date Noted  . Cerebral thrombosis with cerebral infarction 09/10/2020  . MS (multiple sclerosis) (HCC)   . GERD (gastroesophageal reflux disease)   . Essential hypertension   . Chronic back pain   . Prediabetes   . RLS (restless legs syndrome)   . NSTEMI (non-ST elevated myocardial infarction) (HCC) 09/08/2020   Past Medical History:  Past Medical History:  Diagnosis Date  . Chronic back pain   . Essential hypertension   . GERD (gastroesophageal reflux disease)   . MS (multiple sclerosis) (HCC)   . Prediabetes   . RLS (restless legs syndrome)    Past Surgical History:  Past Surgical History:  Procedure Laterality Date  . EYE SURGERY    . TMJ ARTHROPLASTY     HPI:  75 y.o. female admitted to [facility] 09/08/20 with chest pain and SOB; workup for NSTEMI.  Course complicated by syncopal episode whe using bathroom; urgent transfer to [facility]. Pt with increased somnolence; head CT negative for acute injury. MRI showed R PICA acute infarct without hemorrhage. Carotid duplex 3/14 with near-normal vessels. PMH includes MS (chronic R-side weakness), HTN, RLS, chronic back pain   Assessment / Plan / Recommendation Clinical Impression  Pts cognitive linguistic skills appears within functional limits.  Per pt report, some confusion and slurred speech was noted during initial admission, however this has completly resolved. PLOF pt was independent with all ADLS, retired from [employer] and with supportive retired spouse. Pt oriented x4, with excellent recall of hospital course. No acute difficulties exhibited in cognitive functioning, auditory comprehension, verbal expression, or motor speech skills. No ST needs identified.    SLP Assessment  SLP  Recommendation/Assessment: Patient does not need any further Speech Lanaguage Pathology Services SLP Visit Diagnosis: Cognitive communication deficit (R41.841)    Follow Up Recommendations  None    Frequency and Duration           SLP Evaluation Cognition  Overall Cognitive Status: Within Functional Limits for tasks assessed Arousal/Alertness: Awake/alert Orientation Level: Oriented X4 Attention: Focused;Sustained Focused Attention: Appears intact Memory: Appears intact Awareness: Appears intact Problem Solving: Appears intact Executive Function: Reasoning;Organizing Reasoning: Appears intact Organizing: Appears intact Safety/Judgment: Appears intact       Comprehension  Auditory Comprehension Overall Auditory Comprehension: Appears within functional limits for tasks assessed    Expression Expression Primary Mode of Expression: Verbal Verbal Expression Overall Verbal Expression: Appears within functional limits for tasks assessed Written Expression Dominant Hand: Right Written Expression: Within Functional Limits   Oral / Motor  Oral Motor/Sensory Function Overall Oral Motor/Sensory Function: Within functional limits Motor Speech Overall Motor Speech: Appears within functional limits for tasks assessed   GO                    Ardyth Gal MA, CCC-SLP Acute Rehabilitation Services   09/11/2020, 1:33 PM

## 2020-09-11 NOTE — Progress Notes (Addendum)
Progress Note  Patient Name: Caitlin Pierce Date of Encounter: 09/11/2020  Primary Cardiologist: Dr. Laurance Flatten, MD   Subjective   No chest pain or SOB, but has not been OOB much.   Inpatient Medications    Scheduled Meds: . aspirin EC  81 mg Oral Daily  . atorvastatin  80 mg Oral Daily  . metoprolol succinate  25 mg Oral Daily   Continuous Infusions: . heparin 1,400 Units/hr (09/11/20 0437)  . nitroGLYCERIN 10 mcg/min (09/11/20 0005)   PRN Meds: acetaminophen, ondansetron (ZOFRAN) IV   Vital Signs    Vitals:   09/10/20 2318 09/11/20 0005 09/11/20 0527 09/11/20 0830  BP: (!) 114/54 (!) 105/49 (!) 117/53 (!) 113/50  Pulse: 79 65 71 71  Resp:  16 16 15   Temp:  98.6 F (37 C) 98.1 F (36.7 C) 98.1 F (36.7 C)  TempSrc:  Oral Oral Oral  SpO2: 96% 96% 98% 97%  Weight:   87.7 kg   Height:        Intake/Output Summary (Last 24 hours) at 09/11/2020 0934 Last data filed at 09/10/2020 1950 Gross per 24 hour  Intake 480 ml  Output --  Net 480 ml   Filed Weights   09/08/20 1610 09/09/20 0507 09/11/20 0527  Weight: 89.4 kg 89.4 kg 87.7 kg    Physical Exam   General: Well developed, well nourished, female in no acute distress Head: Eyes PERRLA, Head normocephalic and atraumatic Lungs: few rales bilaterally to auscultation. Heart: HRRR S1 S2, without rub or gallop. No murmur. 4/4 extremity pulses are 2+ & equal. No JVD. Abdomen: Bowel sounds are present, abdomen soft and non-tender without masses or  hernias noted. Msk: Normal strength and tone for age. Extremities: No clubbing, cyanosis or edema.    Skin:  No rashes or lesions noted. Neuro: Alert and oriented X 3. Psych:  Good affect, responds appropriately  Labs    Chemistry Recent Labs  Lab 09/09/20 0451 09/10/20 0601 09/11/20 0225  NA 137 138 137  K 3.8 3.5 3.5  CL 108 105 104  CO2 24 27 27   GLUCOSE 123* 102* 108*  BUN 10 11 13   CREATININE 0.51 0.55 0.57  CALCIUM 8.3* 8.4* 8.5*   GFRNONAA >60 >60 >60  ANIONGAP 5 6 6      Hematology Recent Labs  Lab 09/09/20 0451 09/10/20 0601 09/11/20 0225  WBC 8.0 6.3 6.1  RBC 3.11* 3.25* 3.17*  HGB 10.5* 10.8* 10.4*  HCT 30.2* 31.6* 30.4*  MCV 97.1 97.2 95.9  MCH 33.8 33.2 32.8  MCHC 34.8 34.2 34.2  RDW 18.6* 18.4* 18.2*  PLT 214 213 207    Cardiac Enzymes  High Sensitivity Troponin:  Recent Labs  Lab 09/08/20 1706 09/09/20 1325  TROPONINIHS 1,900* 609*     BNP Recent Labs  Lab 09/08/20 1706  BNP 43.0    Lab Results  Component Value Date   CHOL 133 09/10/2020   HDL 57 09/10/2020   LDLCALC 56 09/10/2020   TRIG 99 09/10/2020   CHOLHDL 2.3 09/10/2020   No results found for: TSH Lab Results  Component Value Date   HGBA1C 5.9 (H) 09/10/2020    DDimer No results for input(s): DDIMER in the last 168 hours.   Radiology    CT HEAD WO CONTRAST  Result Date: 09/11/2020 CLINICAL DATA:  Follow-up examination for acute stroke. EXAM: CT HEAD WITHOUT CONTRAST TECHNIQUE: Contiguous axial images were obtained from the base of the skull through the vertex without  intravenous contrast. COMPARISON:  Prior MRI from 09/10/2020 FINDINGS: Brain: Continued interval evolution of right PICA territory infarct, stable in size and distribution from previous. No evidence for hemorrhagic transformation, significant regional mass effect, or other complication. No other new acute intracranial infarct or hemorrhage. 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 No midline shift or mass effect. No hydrocephalus or extra-axial fluid collection. Vascular: No hyperdense vessel. Skull: Scalp soft tissues and calvarium demonstrate no acute finding. Sinuses/Orbits: Globes and orbital soft tissues within normal limits. Paranasal sinuses and mastoid air cells remain clear. Other: None. IMPRESSION: 1. Continued interval evolution of right PICA territory infarct, stable in size and distribution from previous. No evidence for  hemorrhagic transformation, significant regional mass effect, or other complication. 2. No other new acute intracranial abnormality. Electronically Signed   By: Rise Mu M.D.   On: 09/11/2020 03:14   MR ANGIO HEAD WO CONTRAST  Result Date: 09/10/2020 CLINICAL DATA:  75 year old female presenting with confusion. Right cerebellar infarct on brain MRI earlier today. EXAM: MRA HEAD WITHOUT CONTRAST TECHNIQUE: Angiographic images of the Circle of Willis were obtained using MRA technique without intravenous contrast. COMPARISON:  brain MRI 0047 hours today. FINDINGS: No significant intracranial mass effect is evident. No ventriculomegaly. Antegrade flow in the posterior circulation in the distal right vertebral artery appears dominant. There is no distal right vertebral stenosis. Patent vertebrobasilar junction. Right PICA origin does appear to be patent on series 20, image 55. And there is also faint visualization of the right AICA, better than that on the left. Proximal SCA is also appear symmetric and patent. Non dominant left vertebral artery with normal left PICA origin. Patent left vertebrobasilar junction. Patent basilar artery without stenosis. PCA origins are within normal limits. Posterior communicating arteries are diminutive or absent. Tortuous left P1 segment. Bilateral PCA branches are within normal limits. Antegrade flow in both ICA siphons. Normal ophthalmic artery origins. Mild bilateral siphon irregularity but no significant siphon stenosis. Patent carotid termini. Normal MCA and ACA origins. Anterior communicating artery and visible ACA branches are within normal limits. Left MCA M1 bifurcates early without stenosis. Right MCA M1 is patent to the trifurcation. The right MCA trifurcation is ectatic but patent without stenosis. No discrete aneurysm identified. Otherwise the visible bilateral MCA branches are within normal limits. IMPRESSION: 1. Posterior circulation appears patent with no  origin or proximal occlusion of right cerebellar arteries. Dominant right vertebral artery. 2. 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 Ectatic right MCA trifurcation. Otherwise negative anterior circulation. Electronically Signed   By: Odessa Fleming M.D.   On: 09/10/2020 05:07   MR BRAIN W WO CONTRAST  Result Date: 09/10/2020 CLINICAL DATA:  Delirium EXAM: MRI HEAD WITHOUT AND WITH CONTRAST TECHNIQUE: Multiplanar, multiecho pulse sequences of the brain and surrounding structures were obtained without and with intravenous contrast. CONTRAST:  71mL GADAVIST GADOBUTROL 1 MMOL/ML IV SOLN COMPARISON:  None. FINDINGS: Brain: Right PICA territory acute infarct. No acute or chronic hemorrhage. There is multifocal hyperintense T2-weighted signal within the white matter. Parenchymal volume and CSF spaces are normal. The midline structures are normal. There is no abnormal contrast enhancement. Vascular: Major flow voids are preserved. Skull and upper cervical spine: Normal calvarium and skull base. Visualized upper cervical spine and soft tissues are normal. Sinuses/Orbits:No paranasal sinus fluid levels or advanced mucosal thickening. No mastoid or middle ear effusion. Normal orbits. IMPRESSION: 1. Right PICA territory acute infarct. No hemorrhage or mass effect. 2. Findings of chronic microvascular ischemia. Electronically Signed  By: Deatra Robinson M.D.   On: 09/10/2020 02:44   ECHOCARDIOGRAM COMPLETE  Result Date: 09/09/2020    ECHOCARDIOGRAM REPORT   Patient Name:   KIRSTIE QUIRAM Date of Exam: 09/09/2020 Medical Rec #:  [id number]   Height:       64.0 in Accession #:    [id number]  Weight:       197.1 lb Date of Birth:  February 16, 1946   BSA:          1.944 m Patient Age:    74 years    BP:           111/56 mmHg Patient Gender: F           HR:           71 bpm. Exam Location:  Inpatient Procedure: 2D Echo, Cardiac Doppler and Color Doppler Indications:    NSTEMI  History:        Patient has no prior history of  Echocardiogram examinations.                 Acute MI.  Sonographer:    Neomia Dear RDCS Referring Phys: [id number] HEATHER E [place] IMPRESSIONS  1. Left ventricular ejection fraction, by estimation, is 65 to 70%. The left ventricle has normal function. The left ventricle has no regional wall motion abnormalities. Left ventricular diastolic parameters are consistent with Grade I diastolic dysfunction (impaired relaxation).  2. Right ventricular systolic function is normal. The right ventricular size is normal. There is normal pulmonary artery systolic pressure.  3. Left atrial size was mildly dilated.  4. The mitral valve is normal in structure. No evidence of mitral valve regurgitation. No evidence of mitral stenosis.  5. The aortic valve is tricuspid. Aortic valve regurgitation is not visualized. No aortic stenosis is present.  6. The inferior vena cava is normal in size with greater than 50% respiratory variability, suggesting right atrial pressure of 3 mmHg. FINDINGS  Left Ventricle: Left ventricular ejection fraction, by estimation, is 65 to 70%. The left ventricle has normal function. The left ventricle has no regional wall motion abnormalities. The left ventricular internal cavity size was normal in size. There is  no left ventricular hypertrophy. Left ventricular diastolic parameters are consistent with Grade I diastolic dysfunction (impaired relaxation). Indeterminate filling pressures. Right Ventricle: The right ventricular size is normal. No increase in right ventricular wall thickness. Right ventricular systolic function is normal. There is normal pulmonary artery systolic pressure. The tricuspid regurgitant velocity is 2.43 m/s, and  with an assumed right atrial pressure of 3 mmHg, the estimated right ventricular systolic pressure is 26.6 mmHg. Left Atrium: Left atrial size was mildly dilated. Right Atrium: Right atrial size was normal in size. Pericardium: Trivial pericardial effusion is present.  Mitral Valve: The mitral valve is normal in structure. No evidence of mitral valve regurgitation. No evidence of mitral valve stenosis. Tricuspid Valve: The tricuspid valve is normal in structure. Tricuspid valve regurgitation is mild . No evidence of tricuspid stenosis. Aortic Valve: The aortic valve is tricuspid. Aortic valve regurgitation is not visualized. No aortic stenosis is present. Aortic valve mean gradient measures 4.0 mmHg. Aortic valve peak gradient measures 8.6 mmHg. Aortic valve area, by VTI measures 3.05 cm. Pulmonic Valve: The pulmonic valve was normal in structure. Pulmonic valve regurgitation is trivial. No evidence of pulmonic stenosis. Aorta: The aortic root is normal in size and structure. Venous: The inferior vena cava is normal in size with greater than 50% respiratory variability, suggesting  right atrial pressure of 3 mmHg. IAS/Shunts: No atrial level shunt detected by color flow Doppler.  LEFT VENTRICLE PLAX 2D LVIDd:         4.60 cm     Diastology LVIDs:         2.90 cm     LV e' medial:    6.42 cm/s LV PW:         1.10 cm     LV E/e' medial:  12.3 LV IVS:        0.95 cm     LV e' lateral:   10.70 cm/s LVOT diam:     2.30 cm     LV E/e' lateral: 7.4 LV SV:         91 LV SV Index:   47 LVOT Area:     4.15 cm  LV Volumes (MOD) LV vol d, MOD A2C: 78.3 ml LV vol d, MOD A4C: 71.5 ml LV vol s, MOD A2C: 30.1 ml LV vol s, MOD A4C: 22.2 ml LV SV MOD A2C:     48.2 ml LV SV MOD A4C:     71.5 ml LV SV MOD BP:      48.5 ml LEFT ATRIUM             Index       RIGHT ATRIUM           Index LA diam:        3.70 cm 1.90 cm/m  RA Area:     16.80 cm LA Vol (A2C):   79.6 ml 40.94 ml/m RA Volume:   44.10 ml  22.68 ml/m LA Vol (A4C):   72.0 ml 37.03 ml/m LA Biplane Vol: 80.1 ml 41.20 ml/m  AORTIC VALVE                   PULMONIC VALVE AV Area (Vmax):    3.11 cm    PV Vmax:       1.09 m/s AV Area (Vmean):   3.18 cm    PV Vmean:      73.300 cm/s AV Area (VTI):     3.05 cm    PV VTI:        0.228 m AV  Vmax:           147.00 cm/s PV Peak grad:  4.8 mmHg AV Vmean:          93.700 cm/s PV Mean grad:  3.0 mmHg AV VTI:            0.297 m AV Peak Grad:      8.6 mmHg AV Mean Grad:      4.0 mmHg LVOT Vmax:         110.00 cm/s LVOT Vmean:        71.800 cm/s LVOT VTI:          0.218 m LVOT/AV VTI ratio: 0.73  AORTA Ao Root diam: 3.30 cm Ao Asc diam:  3.10 cm MITRAL VALVE               TRICUSPID VALVE MV Area (PHT): 4.71 cm    TR Peak grad:   23.6 mmHg MV Decel Time: 161 msec    TR Vmax:        243.00 cm/s MV E velocity: 78.80 cm/s MV A velocity: 95.50 cm/s  SHUNTS MV E/A ratio:  0.83        Systemic VTI:  0.22 m  Systemic Diam: 2.30 cm Chilton Si MD Electronically signed by Chilton Si MD Signature Date/Time: 09/09/2020/12:44:47 PM    Final    VAS US CAROTID (at [facility] and [facility] only)  Result Date: 09/10/2020 Carotid Arterial Duplex Study Indications:       CVA. Risk Factors:      Hypertension, MS, pre-diabetes, past history of smoking. Comparison Study:  No prior studies. Performing Technologist: Ernestene Mention RVT, RDMS  Examination Guidelines: A complete evaluation includes B-mode imaging, spectral Doppler, color Doppler, and power Doppler as needed of all accessible portions of each vessel. Bilateral testing is considered an integral part of a complete examination. Limited examinations for reoccurring indications may be performed as noted.  Right Carotid Findings: +----------+--------+--------+--------+------------------+------------------+           PSV cm/sEDV cm/sStenosisPlaque DescriptionComments           +----------+--------+--------+--------+------------------+------------------+ CCA Prox  88      20                                                   +----------+--------+--------+--------+------------------+------------------+ CCA Distal76      16                                intimal thickening  +----------+--------+--------+--------+------------------+------------------+ ICA Prox  81      26                                intimal thickening +----------+--------+--------+--------+------------------+------------------+ ICA Distal98      28                                                   +----------+--------+--------+--------+------------------+------------------+ ECA       178     0                                                    +----------+--------+--------+--------+------------------+------------------+ +----------+--------+-------+----------------+-------------------+           PSV cm/sEDV cmsDescribe        Arm Pressure (mmHG) +----------+--------+-------+----------------+-------------------+ [id number]     2      Multiphasic, WNL                    +----------+--------+-------+----------------+-------------------+ +---------+--------+--+--------+--+---------+ VertebralPSV cm/s53EDV cm/s20Antegrade +---------+--------+--+--------+--+---------+  Left Carotid Findings: +----------+-------+-------+--------+------------------------+-----------------+           PSV    EDV    StenosisPlaque Description      Comments                    cm/s   cm/s                                                     +----------+-------+-------+--------+------------------------+-----------------+ CCA Prox  101    19                                                       +----------+-------+-------+--------+------------------------+-----------------+  CCA Distal75     16                                     intimal                                                                   thickening        +----------+-------+-------+--------+------------------------+-----------------+ ICA Prox  93     30             focal, calcific and     intimal                                           smooth                  thickening         +----------+-------+-------+--------+------------------------+-----------------+ ICA Distal96     29                                                       +----------+-------+-------+--------+------------------------+-----------------+ ECA       151    1                                                        +----------+-------+-------+--------+------------------------+-----------------+ +----------+--------+--------+----------------+-------------------+           PSV cm/sEDV cm/sDescribe        Arm Pressure (mmHG) +----------+--------+--------+----------------+-------------------+ [id number]      0       Multiphasic, WNL                    +----------+--------+--------+----------------+-------------------+ +---------+--------+--+--------+-+---------+ VertebralPSV cm/s38EDV cm/s9Antegrade +---------+--------+--+--------+-+---------+   Summary: Right Carotid: The extracranial vessels were near-normal with only minimal wall                thickening or plaque. Left Carotid: The extracranial vessels were near-normal with only minimal wall               thickening or plaque. Vertebrals:  Bilateral vertebral arteries demonstrate antegrade flow. Subclavians: Normal flow hemodynamics were seen in bilateral subclavian              arteries. *See table(s) above for measurements and observations.     Preliminary    Telemetry    03/15 SR, PVCs- Personally Reviewed  ECG    No new tracing as of 09/11/20- Personally Reviewed  Cardiac Studies   Echocardiogram 09/09/20:  1. Left ventricular ejection fraction, by estimation, is 65 to 70%. The  left ventricle has normal function. The left ventricle has no regional  wall motion abnormalities. Left ventricular diastolic parameters are  consistent with Grade I diastolic  dysfunction (impaired relaxation).  2. Right ventricular systolic function is normal.  The right ventricular  size is normal. There is normal pulmonary artery  systolic pressure.  3. Left atrial size was mildly dilated.  4. The mitral valve is normal in structure. No evidence of mitral valve  regurgitation. No evidence of mitral stenosis.  5. The aortic valve is tricuspid. Aortic valve regurgitation is not  visualized. No aortic stenosis is present.  6. The inferior vena cava is normal in size with greater than 50%  respiratory variability, suggesting right atrial pressure of 3 mmHg.   Patient Profile     75 y.o. female with a hx of MS, prior tobacco use, and no known cardiac history who [facility] with chest pain found to have trop elevation consistent with NSTEMI. [facility] complicated by syncopal episode after using the bathroom prompting more urgent transfer to [facility]. Course her complicated by AMS/significant and somnolence found to have acute right PICA ischemic infarction.   Assessment & Plan    1. NSTEMI:  EF normal w/ no WMA on echo  Best option is to delay cath for 2 weeks, to allow recovery from CVA  Plan cardiac CTA today to assess for any severe ischemic CAD that would potentially drive invasive ischemic evaluation sooner.  Will d/c nitro IV, add Imdur, cardiac rehab to see  DC IV heparin tonight/in the morning which would complete 72 hours treatment.  See how she does w/ ambulation  Continue ASA, high-dose statin, BB  BP will not tolerate dose increase  2. Acute right PICA ischemic infarction:  See CT report from today  No hemorrhagic transformation   Neuro seeing  Per IM/Neuro > pending results on coronary CTA, we may indeed warrant loop recorder evaluation  3. MS: - [facility] as [facility] - per IM  Anemia and other issues, per IM  Signed, Theodore Demarkhonda Barrett, PA-C 09/11/2020 9:45 AM   ATTENDING ATTESTATION  I have seen, examined and evaluated the patient this AM along with Theodore Demarkhonda Barrett, PA-C.  After reviewing all the available data and chart,  we discussed the patients laboratory, study & physical findings as well as symptoms in detail. I agree with her findings, examination as well as impression recommendations as per our discussion.    Attending adjustments noted in italics.   Again difficult case scenario where patient comes in with a non-STEMI now has a stroke.  Ideally we would like to wait a couple weeks prior to instrumenting her, but if she is indeed having significant ischemic symptoms or disease, this would push us to act sooner based on the neurology recommendations that the risk with the outweighed by benefit.  As such, plans coronary calcium score today for ischemic evaluation.  This will help driver decision about need for early versus delayed revascularization.  The next decision will be about anticoagulation.  My recommendation probably would be aspirin and Plavix versus aspirin Brilinta based on was found on the CT angio.  Morphology coronary CTA and seeing how she does ambulating in the hallway off of drips in the morning.    Bryan Lemmaavid Hannahmarie Asberry, M.D., M.S. Interventional Cardiologist   Pager # [phone] Phone # [phone] [address] [place], [place] [address]        For questions or updates, please contact   Please consult [email or web address] for contact info under Cardiology/STEMI.

## 2020-09-11 NOTE — Plan of Care (Signed)
  Problem: Clinical Measurements: Goal: Ability to maintain clinical measurements within normal limits will improve Outcome: Progressing Goal: Will remain free from infection Outcome: Progressing   Problem: Activity: Goal: Risk for activity intolerance will decrease Outcome: Progressing   Problem: Nutrition: Goal: Adequate nutrition will be maintained Outcome: Progressing   Problem: Coping: Goal: Level of anxiety will decrease Outcome: Progressing   

## 2020-09-11 NOTE — Progress Notes (Signed)
ANTICOAGULATION CONSULT NOTE Pharmacy Consult for heparin Indication: chest pain/ACS  No Known Allergies  Patient Measurements: Height: 5\' 4"  (162.6 cm) Weight: 87.7 kg (193 lb 5.5 oz) IBW/kg (Calculated) : 54.7 Heparin Dosing Weight: 81kg (per OSH records)  Vital Signs: Temp: 98.2 F (36.8 C) (03/15 1204) Temp Source: Oral (03/15 1204) BP: 116/47 (03/15 1204) Pulse Rate: 66 (03/15 1256)  Labs: Recent Labs    09/08/20 1706 09/08/20 1821 09/09/20 0451 09/09/20 1325 09/10/20 0601 09/11/20 0225 09/11/20 1235  HGB  --    < > 10.5*  --  10.8* 10.4*  --   HCT  --   --  30.2*  --  31.6* 30.4*  --   PLT  --   --  214  --  213 207  --   HEPARINUNFRC  --    < > 0.23* 0.41 0.35 0.21* 0.35  CREATININE 0.59  --  0.51  --  0.55 0.57  --   TROPONINIHS 1,900*  --   --  609*  --   --   --    < > = values in this interval not displayed.    Estimated Creatinine Clearance: 68.1 mL/min (by C-G formula based on SCr of 0.57 mg/dL).  Assessment: 75 y.o. female with NSTEMI and CVA for heparin -heparin level at goal   Goal of Therapy:  Heparin level 0.3-0.5 units/mL Monitor platelets by anticoagulation protocol: Yes   Plan:  -No heparin changes needed -Daily heparin level and CBC  61, PharmD Clinical Pharmacist **Pharmacist phone directory can now be found on [email or web address] ([email or web address]).  Listed under [facility].

## 2020-09-12 ENCOUNTER — Inpatient Hospital Stay (HOSPITAL_COMMUNITY): Payer: Medicare Other

## 2020-09-12 ENCOUNTER — Encounter (HOSPITAL_COMMUNITY): Payer: Self-pay | Admitting: Cardiology

## 2020-09-12 DIAGNOSIS — I10 Essential (primary) hypertension: Secondary | ICD-10-CM | POA: Diagnosis not present

## 2020-09-12 DIAGNOSIS — R7303 Prediabetes: Secondary | ICD-10-CM | POA: Diagnosis not present

## 2020-09-12 DIAGNOSIS — I25110 Atherosclerotic heart disease of native coronary artery with unstable angina pectoris: Secondary | ICD-10-CM

## 2020-09-12 DIAGNOSIS — I6330 Cerebral infarction due to thrombosis of unspecified cerebral artery: Secondary | ICD-10-CM | POA: Diagnosis not present

## 2020-09-12 DIAGNOSIS — R931 Abnormal findings on diagnostic imaging of heart and coronary circulation: Secondary | ICD-10-CM

## 2020-09-12 DIAGNOSIS — I2510 Atherosclerotic heart disease of native coronary artery without angina pectoris: Secondary | ICD-10-CM

## 2020-09-12 DIAGNOSIS — I214 Non-ST elevation (NSTEMI) myocardial infarction: Secondary | ICD-10-CM | POA: Diagnosis not present

## 2020-09-12 LAB — CBC
HCT: 30.5 % — ABNORMAL LOW (ref 36.0–46.0)
Hemoglobin: 10.2 g/dL — ABNORMAL LOW (ref 12.0–15.0)
MCH: 32.6 pg (ref 26.0–34.0)
MCHC: 33.4 g/dL (ref 30.0–36.0)
MCV: 97.4 fL (ref 80.0–100.0)
Platelets: 194 10*3/uL (ref 150–400)
RBC: 3.13 MIL/uL — ABNORMAL LOW (ref 3.87–5.11)
RDW: 18.4 % — ABNORMAL HIGH (ref 11.5–15.5)
WBC: 5.3 10*3/uL (ref 4.0–10.5)
nRBC: 0 % (ref 0.0–0.2)

## 2020-09-12 LAB — BASIC METABOLIC PANEL
Anion gap: 7 (ref 5–15)
BUN: 14 mg/dL (ref 8–23)
CO2: 25 mmol/L (ref 22–32)
Calcium: 8.5 mg/dL — ABNORMAL LOW (ref 8.9–10.3)
Chloride: 108 mmol/L (ref 98–111)
Creatinine, Ser: 0.56 mg/dL (ref 0.44–1.00)
GFR, Estimated: >60 mL/min (ref >60)
Glucose, Bld: 109 mg/dL — ABNORMAL HIGH (ref 70–99)
Potassium: 3.5 mmol/L (ref 3.5–5.1)
Sodium: 140 mmol/L (ref 135–145)

## 2020-09-12 LAB — HEPARIN LEVEL (UNFRACTIONATED): Heparin Unfractionated: 0.51 IU/mL (ref 0.30–0.70)

## 2020-09-12 MED ORDER — METOPROLOL SUCCINATE ER 25 MG PO TB24
25.0000 mg | ORAL_TABLET | Freq: Every day | ORAL | 0 refills | Status: AC
Start: 2020-09-13 — End: ?

## 2020-09-12 MED ORDER — ATORVASTATIN CALCIUM 80 MG PO TABS
80.0000 mg | ORAL_TABLET | Freq: Every day | ORAL | 0 refills | Status: AC
Start: 2020-09-13 — End: ?

## 2020-09-12 MED ORDER — ISOSORBIDE MONONITRATE ER 30 MG PO TB24: 30.0000 mg | ORAL_TABLET | Freq: Every day | ORAL | 0 refills | Status: AC

## 2020-09-12 MED ORDER — POTASSIUM CHLORIDE CRYS ER 20 MEQ PO TBCR
20.0000 meq | EXTENDED_RELEASE_TABLET | Freq: Once | ORAL | Status: AC
  Administered 2020-09-12: 20 meq via ORAL
  Filled 2020-09-12: qty 1

## 2020-09-12 MED ORDER — ENOXAPARIN SODIUM 40 MG/0.4ML ~~LOC~~ SOLN
40.0000 mg | Freq: Every day | SUBCUTANEOUS | Status: DC
  Administered 2020-09-12: 40 mg via SUBCUTANEOUS
  Filled 2020-09-12: qty 0.4

## 2020-09-12 MED ORDER — CLOPIDOGREL BISULFATE 75 MG PO TABS: 75.0000 mg | ORAL_TABLET | Freq: Every day | ORAL | 1 refills | Status: AC

## 2020-09-12 MED ORDER — NITROGLYCERIN 0.4 MG SL SUBL
SUBLINGUAL_TABLET | SUBLINGUAL | Status: AC
  Filled 2020-09-12: qty 2

## 2020-09-12 MED ORDER — IOHEXOL 350 MG/ML SOLN
80.0000 mL | Freq: Once | INTRAVENOUS | Status: AC | PRN
  Administered 2020-09-12: 80 mL via INTRAVENOUS

## 2020-09-12 MED ORDER — ASPIRIN 81 MG PO TBEC
81.0000 mg | DELAYED_RELEASE_TABLET | Freq: Every day | ORAL | 0 refills | Status: AC
Start: 2020-09-13 — End: ?

## 2020-09-12 NOTE — Progress Notes (Signed)
PROGRESS NOTE    Caitlin Pierce  EXH:[id number] DOB: 01/19/1946 DOA: 09/08/2020 PCP: Pcp, No   Brief Narrative: 75 year old past medical history significant for hypertension, MS, chronic back pain, RLS who presented on 3/12 to OSH with chest pain.  Patient was found to have non-STEMI and was transferred to [facility] for heart cath.  On arrival to [facility] patient was noted to have altered mental status after she received at prior hospital Phenergan and Zofran.  After further  evaluation for AMS  patient was found to have acute stroke of right PICA territory.  -Neurology has been consulted and has cleared patient to have heart cath.  -Cardiology following, plan for coronary CTA   Assessment & Plan:   NSTEMI: -Patient presented to [facility] with chest pain, elevation of troponin changes and EKG consistent with non-STEMI.  Troponin 609. -2D echocardiogram noted preserved EF, no wall motion abnormality -Cardiology consulting, cath deferred in the setting of acute CVA -Plan for coronary CTA  Acute metabolic encephalopathy:  -Likely from polypharmacy, resolved  Acute PICA  Territory infarct; -FYB:OFBPZ PICA territory acute infarct. No hemorrhage or mass effect. -MRA: No evidence of large vessel occlusion. -PT OT evaluation by speech evaluation -Neurology following, repeat CT head was stable, IV heparin discussed -LDL 56, hemoglobin A1c: 5.9. -Echo: Ejection fraction 65 percent, grade 1 diastolic dysfunction, left atrial enlargement -Carotid doppler: minimal Plaque.  -Started on Lipitor.  -Neurology recommends dual antiplatelet at discharge could be aspirin Plavix or aspirin and Brilinta-defered to cardiology -Also recommended loop recorder  Multiple Sclerosis: Patient to follow-up with her outpatient neurologist  Pre- Diabetes type 2: -hb-A1c 5.9 -On Diet.   Chronic Back pain/RLS;  Resume requip   HTN; was not on medications.   [facility] will take over patient care.   Estimated body  mass index is 33.19 kg/m as calculated from the following:   Height as of this encounter: 5\' 4"  (1.626 m).   Weight as of this encounter: 87.7 kg.   DVT prophylaxis: Heparin Gtt Code Status: Full code Family Communication: Husband at Bedside 3-14.  Disposition Plan:  Status is: Inpatient  Remains inpatient appropriate because:IV treatments appropriate due to intensity of illness or inability to take PO   Dispo: The patient is from: Home              Anticipated d/c is to: Home              Patient currently is not medically stable to d/c.  Pending coronary CTA   Difficult to place patient No        Consultants:   Cardiology primary  Neurology/   Procedures:   ECHO;   Antimicrobials:    Subjective: Feels well, denies any complaints, no chest pain or dyspnea   Objective: Vitals:   09/11/20 2300 09/12/20 0337 09/12/20 0749 09/12/20 1203  BP: (!) 125/55 (!) 98/52 (!) 132/44 (!) 117/42  Pulse: 75 69 65 69  Resp: 18 18 16 16   Temp: 97.8 F (36.6 C) 98 F (36.7 C) 97.7 F (36.5 C) 98.1 F (36.7 C)  TempSrc: Oral Oral Oral Oral  SpO2: 98% 100% 98% 98%  Weight:      Height:       No intake or output data in the 24 hours ending 09/12/20 1614 Filed Weights   09/08/20 1610 09/09/20 0507 09/11/20 0527  Weight: 89.4 kg 89.4 kg 87.7 kg    Examination: Gen: Awake, Alert, Oriented X 3,  HEENT: Pupils equal and  reactive, no JVD Lungs: Good air movement bilaterally, CTAB CVS: RRR,No Gallops,Rubs or new Murmurs Abd: soft, Non tender, non distended, BS present Extremities: No edema   Data Reviewed: I have personally reviewed following labs and imaging studies  CBC: Recent Labs  Lab 09/09/20 0451 09/10/20 0601 09/11/20 0225 09/12/20 0334  WBC 8.0 6.3 6.1 5.3  HGB 10.5* 10.8* 10.4* 10.2*  HCT 30.2* 31.6* 30.4* 30.5*  MCV 97.1 97.2 95.9 97.4  PLT 214 213 207 194   Basic Metabolic Panel: Recent Labs  Lab 09/08/20 1706 09/09/20 0451 09/10/20 0601  09/11/20 0225 09/12/20 0334  NA 137 137 138 137 140  K 3.9 3.8 3.5 3.5 3.5  CL 111 108 105 104 108  CO2 22 24 27 27 25   GLUCOSE 138* 123* 102* 108* 109*  BUN 13 10 11 13 14   CREATININE 0.59 0.51 0.55 0.57 0.56  CALCIUM 8.2* 8.3* 8.4* 8.5* 8.5*  MG  --  1.8 1.9 1.9  --    GFR: Estimated Creatinine Clearance: 68.1 mL/min (by C-G formula based on SCr of 0.56 mg/dL). Liver Function Tests: No results for input(s): AST, ALT, ALKPHOS, BILITOT, PROT, ALBUMIN in the last 168 hours. No results for input(s): LIPASE, AMYLASE in the last 168 hours. No results for input(s): AMMONIA in the last 168 hours. Coagulation Profile: No results for input(s): INR, PROTIME in the last 168 hours. Cardiac Enzymes: No results for input(s): CKTOTAL, CKMB, CKMBINDEX, TROPONINI in the last 168 hours. BNP (last 3 results) No results for input(s): PROBNP in the last 8760 hours. HbA1C: Recent Labs    09/10/20 0601  HGBA1C 5.9*   CBG: No results for input(s): GLUCAP in the last 168 hours. Lipid Profile: Recent Labs    09/10/20 0601  CHOL 133  HDL 57  LDLCALC 56  TRIG 99  CHOLHDL 2.3   Thyroid Function Tests: No results for input(s): TSH, T4TOTAL, FREET4, T3FREE, THYROIDAB in the last 72 hours. Anemia Panel: No results for input(s): VITAMINB12, FOLATE, FERRITIN, TIBC, IRON, RETICCTPCT in the last 72 hours. Sepsis Labs: Recent Labs  Lab 09/08/20 1706  LATICACIDVEN 1.5    Recent Results (from the past 240 hour(s))  Culture, Urine     Status: Abnormal   Collection Time: 09/08/20  1:47 AM   Specimen: Urine, Random  Result Value Ref Range Status   Specimen Description URINE, RANDOM  Final   Special Requests   Final    NONE Performed at [facility], [address] [place], [place] [address]    Culture MULTIPLE SPECIES PRESENT, SUGGEST RECOLLECTION (A)  Final   Report Status 09/10/2020 FINAL  Final  Culture, blood (routine x 2)     Status: None (Preliminary result)   Collection Time:  09/08/20  9:13 PM   Specimen: BLOOD  Result Value Ref Range Status   Specimen Description BLOOD SITE NOT SPECIFIED  Final   Special Requests   Final    BOTTLES DRAWN AEROBIC AND ANAEROBIC Blood Culture adequate volume   Culture   Final    NO GROWTH 4 DAYS Performed at [facility], [address] [place], [place] [address]    Report Status PENDING  Incomplete  Culture, blood (routine x 2)     Status: None (Preliminary result)   Collection Time: 09/08/20  9:20 PM   Specimen: BLOOD  Result Value Ref Range Status   Specimen Description BLOOD SITE NOT SPECIFIED  Final   Special Requests   Final  BOTTLES DRAWN AEROBIC ONLY Blood Culture adequate volume   Culture   Final    NO GROWTH 4 DAYS Performed at [facility], [address] [place], [place] [address]    Report Status PENDING  Incomplete         Radiology Studies: CT HEAD WO CONTRAST  Result Date: 09/11/2020 CLINICAL DATA:  Follow-up examination for acute stroke. EXAM: CT HEAD WITHOUT CONTRAST TECHNIQUE: Contiguous axial images were obtained from the base of the skull through the vertex without intravenous contrast. COMPARISON:  Prior MRI from 09/10/2020 FINDINGS: Brain: Continued interval evolution of right PICA territory infarct, stable in size and distribution from previous. No evidence for hemorrhagic transformation, significant regional mass effect, or other complication. No other new acute intracranial infarct or hemorrhage. 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 No midline shift or mass effect. No hydrocephalus or extra-axial fluid collection. Vascular: No hyperdense vessel. Skull: Scalp soft tissues and calvarium demonstrate no acute finding. Sinuses/Orbits: Globes and orbital soft tissues within normal limits. Paranasal sinuses and mastoid air cells remain clear. Other: None. IMPRESSION: 1. Continued interval evolution of right PICA territory infarct, stable in size and distribution  from previous. No evidence for hemorrhagic transformation, significant regional mass effect, or other complication. 2. No other new acute intracranial abnormality. Electronically Signed   By: Rise Mu M.D.   On: 09/11/2020 03:14   CT CORONARY MORPH W/CTA COR W/SCORE W/CA W/CM &/OR WO/CM  Addendum Date: 09/12/2020   ADDENDUM REPORT: 09/12/2020 12:57 CLINICAL DATA:  75 year old female with elevated troponin. EXAM: Cardiac/Coronary  CTA TECHNIQUE: The patient was scanned on a [employer]. FINDINGS: A 120 kV prospective scan was triggered in the descending thoracic aorta at 111 HU's. Axial non-contrast 3 mm slices were carried out through the heart. The data set was analyzed on a dedicated work station and scored using the Agatson method. Gantry rotation speed was 250 msecs and collimation was .6 mm. 100 mg of PO Metoprolol and 0.8 mg of sl NTG were given. The 3D data set was reconstructed in 5% intervals of the 67-82 % of the R-R cycle. Diastolic phases were analyzed on a dedicated work station using MPR, MIP and VRT modes. The patient received 80 cc of contrast. Aorta: Normal size. Minimal diffuse calcifications. No dissection. Aortic Valve:  Trileaflet.  No calcifications. Coronary Arteries:  Normal coronary origin.  Right dominance. RCA is a large dominant artery that gives rise to PDA and PLA. There is minimal calcified plaque in the proximal RCA with stenosis 0-24%. Mid and distal RCA, PLA and PDA have no obvious plaque. Left main is a large artery that gives rise to LAD and LCX arteries. Distal Left main has mild calcified plaque with stenosis 0-24%. LAD is a large vessel that gives rise to two small diagonal arteries. Proximal LAD has minimal calcified plaque with stenosis 0-24%. Mid to distal LAD has mild to moderate calcified plaque with stenosis 50-69%. D1,2 are small arteries with no obvious plaque. LCX is a small non-dominant artery that gives rise to one small OM1 branch. There  is no plaque. Other findings: Normal pulmonary vein drainage into the left atrium. Normal left atrial appendage without a thrombus. Normal size of the pulmonary artery. IMPRESSION: 1. Coronary calcium score of 59. This was 60 percentile for age and sex matched control. 2. Normal coronary origin with right dominance. 3. CAD-RADS 3. Moderate stenosis. Consider symptom-guided anti-ischemic pharmacotherapy as well as risk factor modification per guideline directed care.  Additional analysis with CT FFR will be submitted. Electronically Signed   By: Tobias Alexander   On: 09/12/2020 12:57   Result Date: 09/12/2020 EXAM: OVER-READ INTERPRETATION  CT CHEST The following report is an over-read performed by radiologist Dr. Charlett Nose of [facility] on 09/12/2020. This over-read does not include interpretation of cardiac or coronary anatomy or pathology. The coronary CTA interpretation by the cardiologist is attached. COMPARISON:  09/08/2020 FINDINGS: Vascular: Heart is normal size.  Aorta normal caliber. Mediastinum/Nodes: No adenopathy. Lungs/Pleura: Linear scarring and/or atelectasis in the lung bases. No effusions. Upper Abdomen: Small scattered hypodensities in the liver, stable since prior study, likely cysts. Musculoskeletal: Chest wall soft tissues are unremarkable. No acute bony abnormality. IMPRESSION: Bibasilar scarring and/or atelectasis. No acute extra cardiac abnormality. Electronically Signed: By: Charlett Nose M.D. On: 09/12/2020 12:04        Scheduled Meds: . aspirin EC  81 mg Oral Daily  . atorvastatin  80 mg Oral Daily  . enoxaparin (LOVENOX) injection  40 mg Subcutaneous Daily  . isosorbide mononitrate  30 mg Oral Daily  . metoprolol succinate  25 mg Oral Daily  . nitroGLYCERIN       Continuous Infusions:    LOS: 4 days    Time spent: 35 minutes.     Zannie Cove, MD [facility] 09/12/2020, 4:14 PM

## 2020-09-12 NOTE — Progress Notes (Signed)
Physical Therapy Treatment Patient Details Name: Caitlin Pierce MRN: [id number] DOB: February 01, 1946 Today's Date: 09/12/2020    History of Present Illness Pt is a 75 y.o. female admitted to [facility] 09/08/20 with chest pain and SOB; workup for NSTEMI.  Course complicated by syncopal episode using bathroom; urgent transfer to [facility]. Pt with increased somnolence; head CT negative for acute injury. MRI showed R PICA acute infarct without hemorrhage. Carotid duplex 3/14 with near-normal vessels. PMH includes MS (chronic R-side weakness), HTN, RLS, chronic back pain.    PT Comments    Pt received in supine, agreeable to therapy session and with good participation and tolerance for mobility. Pt able perform household distance gait trial in hallway using QC with no LOB and agreeable to sit up in chair afterward. VSS on RA with exertion. Pt continues to benefit from PT services to progress toward functional mobility goals. DC recs below remain appropriate, unsure if pt owns RW or not may need to confirm with family?   Follow Up Recommendations  Outpatient PT;Supervision for mobility/OOB     Equipment Recommendations  Other (comment) (could benefit from RW, pt may own?)    Recommendations for Other Services       Precautions / Restrictions Precautions Precautions: Fall;Other (comment) Precaution Comments: H/o MS with R-side weakness/numbness (RLE > RUE) Restrictions Weight Bearing Restrictions: No    Mobility  Bed Mobility Overal bed mobility: Modified Independent             General bed mobility comments: use of bed features, no physical assist needed    Transfers Overall transfer level: Needs assistance Equipment used: Quad cane Transfers: Sit to/from Stand Sit to Stand: Supervision         General transfer comment: from EOB and chair heights using QC  Ambulation/Gait Ambulation/Gait assistance: Supervision Gait Distance (Feet): 200 Feet Assistive device: Quad cane Gait  Pattern/deviations: Step-to pattern;Decreased weight shift to right;Decreased dorsiflexion - right;Trunk flexed Gait velocity: Decreased Gait velocity interpretation: 1.31 - 2.62 ft/sec, indicative of limited community ambulator General Gait Details: pt drags R foot with circumduction-type pattern to walk; fair cadence, cues for activity pacing at times and environmental awareness, no LOB; VSS on RA   Stairs Stairs:  (pt reports no STE)           Wheelchair Mobility    Modified Rankin (Stroke Patients Only) Modified Rankin (Stroke Patients Only) Pre-Morbid Rankin Score: No significant disability Modified Rankin: Moderate disability     Balance Overall balance assessment: Needs assistance Sitting-balance support: No upper extremity supported;Feet supported Sitting balance-Leahy Scale: Good Sitting balance - Comments: Able to don socks sitting EOB   Standing balance support: Single extremity supported;Bilateral upper extremity supported;During functional activity Standing balance-Leahy Scale: Fair Standing balance comment: Can briefly stand without UE support no LOB to don mask, etc; needs U UE for dynamic tasks                            Cognition Arousal/Alertness: Awake/alert Behavior During Therapy: WFL for tasks assessed/performed Overall Cognitive Status: Within Functional Limits for tasks assessed                                 General Comments: pt cooperative; very flat but fair safety awareness      Exercises Other Exercises Other Exercises: encouraged BLE ROM prior to OOB/chair mobility and TID    General Comments  General comments (skin integrity, edema, etc.): SpO2 WNL on RA throughout 95-100%; HR 67-73 bpm; BP 110/76 (88) after gait trial seated in chair; pt c/o headache but also skipped lunch, encouraged her to order dinner      Pertinent Vitals/Pain Pain Assessment: Faces Faces Pain Scale: Hurts little more Pain Location:  headache Pain Descriptors / Indicators: Headache Pain Intervention(s): Monitored during session;Repositioned;Patient requesting pain meds-RN notified (BP checked and WNL)    Home Living                      Prior Function            PT Goals (current goals can now be found in the care plan section) Acute Rehab PT Goals Patient Stated Goal: Return home, regain independence PT Goal Formulation: With patient Time For Goal Achievement: 09/24/20 Potential to Achieve Goals: Good Progress towards PT goals: Progressing toward goals    Frequency    Min 4X/week      PT Plan Current plan remains appropriate    Co-evaluation              AM-PAC PT "6 Clicks" Mobility   Outcome Measure  Help needed turning from your back to your side while in a flat bed without using bedrails?: None Help needed moving from lying on your back to sitting on the side of a flat bed without using bedrails?: None Help needed moving to and from a bed to a chair (including a wheelchair)?: A Little Help needed standing up from a chair using your arms (e.g., wheelchair or bedside chair)?: A Little Help needed to walk in hospital room?: A Little Help needed climbing 3-5 steps with a railing? : A Little 6 Click Score: 20    End of Session Equipment Utilized During Treatment: Gait belt Activity Tolerance: Patient tolerated treatment well Patient left: in chair;with call bell/phone within reach;with nursing/sitter in room (RN entering room to administer pain meds/update her on plan) Nurse Communication: Mobility status;Patient requests pain meds PT Visit Diagnosis: Other abnormalities of gait and mobility (R26.89);Muscle weakness (generalized) (M62.81);Other symptoms and signs involving the nervous system (R29.898)     Time: 8938-1017 PT Time Calculation (min) (ACUTE ONLY): 15 min  Charges:  $Gait Training: 8-22 mins                     Caitlin Lievanos P., PTA Acute Rehabilitation Services Pager:  [phone] Office: [phone]   Angus [place]ms 09/12/2020, 4:51 PM

## 2020-09-12 NOTE — Progress Notes (Signed)
CARDIAC REHAB PHASE I   PRE:  Rate/Rhythm: 67 SR    BP: sitting 108/52    SaO2: 93 RA  MODE:  Ambulation: 480 ft   POST:  Rate/Rhythm: 87 SR    BP: sitting 137/61     SaO2: 91 RA  Pt able to don socks, stand independently and walk with RW. Talked entire walk, denied CP, some SOB with mask. Reviewed ed. Awaiting plan to determine need for CRPII.  [id number]   Caitlin Pierce CES, ACSM 09/12/2020 10:42 AM

## 2020-09-12 NOTE — Progress Notes (Addendum)
Progress Note  Patient Name: Caitlin Pierce Date of Encounter: 09/12/2020  [facility] Cardiologist: No primary care provider on file.   Subjective   Doing well this morning.   Inpatient Medications    Scheduled Meds: . aspirin EC  81 mg Oral Daily  . atorvastatin  80 mg Oral Daily  . isosorbide mononitrate  30 mg Oral Daily  . metoprolol succinate  25 mg Oral Daily   Continuous Infusions:  PRN Meds: acetaminophen, ondansetron (ZOFRAN) IV   Vital Signs    Vitals:   09/11/20 1938 09/11/20 2300 09/12/20 0337 09/12/20 0749  BP: (!) 116/57 (!) 125/55 (!) 98/52 (!) 132/44  Pulse: 81 75 69 65  Resp: 18 18 18 16   Temp: 98.3 F (36.8 C) 97.8 F (36.6 C) 98 F (36.7 C) 97.7 F (36.5 C)  TempSrc: Oral Oral Oral Oral  SpO2: 95% 98% 100% 98%  Weight:      Height:       No intake or output data in the 24 hours ending 09/12/20 1033 Last 3 Weights 09/11/2020 09/09/2020 09/08/2020  Weight (lbs) 193 lb 5.5 oz 197 lb 1.5 oz 197 lb 1.5 oz  Weight (kg) 87.7 kg 89.4 kg 89.4 kg      Telemetry    NSR - Personally Reviewed  ECG   No new tracing this morning  Physical Exam   GEN: No acute distress.   Neck: No JVD Cardiac: RRR, no murmurs, rubs, or gallops.  Respiratory: Clear to auscultation bilaterally. GI: Soft, nontender, non-distended  MS: No edema; No deformity. Neuro:  Nonfocal  Psych: Normal affect   Labs    High Sensitivity Troponin:   Recent Labs  Lab 09/08/20 1706 09/09/20 1325  TROPONINIHS 1,900* 609*      Chemistry Recent Labs  Lab 09/10/20 0601 09/11/20 0225 09/12/20 0334  NA 138 137 140  K 3.5 3.5 3.5  CL 105 104 108  CO2 27 27 25   GLUCOSE 102* 108* 109*  BUN 11 13 14   CREATININE 0.55 0.57 0.56  CALCIUM 8.4* 8.5* 8.5*  GFRNONAA >60 >60 >60  ANIONGAP 6 6 7      Hematology Recent Labs  Lab 09/10/20 0601 09/11/20 0225 09/12/20 0334  WBC 6.3 6.1 5.3  RBC 3.25* 3.17* 3.13*  HGB 10.8* 10.4* 10.2*  HCT 31.6* 30.4* 30.5*  MCV 97.2  95.9 97.4  MCH 33.2 32.8 32.6  MCHC 34.2 34.2 33.4  RDW 18.4* 18.2* 18.4*  PLT 213 207 194    BNP Recent Labs  Lab 09/08/20 1706  BNP 43.0     DDimer No results for input(s): DDIMER in the last 168 hours.   Radiology    CT HEAD WO CONTRAST  Result Date: 09/11/2020 CLINICAL DATA:  Follow-up examination for acute stroke. EXAM: CT HEAD WITHOUT CONTRAST TECHNIQUE: Contiguous axial images were obtained from the base of the skull through the vertex without intravenous contrast. COMPARISON:  Prior MRI from 09/10/2020 FINDINGS: Brain: Continued interval evolution of right PICA territory infarct, stable in size and distribution from previous. No evidence for hemorrhagic transformation, significant regional mass effect, or other complication. No other new acute intracranial infarct or hemorrhage. 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 No midline shift or mass effect. No hydrocephalus or extra-axial fluid collection. Vascular: No hyperdense vessel. Skull: Scalp soft tissues and calvarium demonstrate no acute finding. Sinuses/Orbits: Globes and orbital soft tissues within normal limits. Paranasal sinuses and mastoid air cells remain clear. Other: None. IMPRESSION: 1. Continued interval  evolution of right PICA territory infarct, stable in size and distribution from previous. No evidence for hemorrhagic transformation, significant regional mass effect, or other complication. 2. No other new acute intracranial abnormality. Electronically Signed   By: Rise Mu M.D.   On: 09/11/2020 03:14   VAS US CAROTID (at [facility] and [facility] only)  Result Date: 09/11/2020 Carotid Arterial Duplex Study Indications:       CVA. Risk Factors:      Hypertension, MS, pre-diabetes, past history of smoking. Comparison Study:  No prior studies. Performing Technologist: Ernestene Mention RVT, RDMS  Examination Guidelines: A complete evaluation includes B-mode imaging, spectral Doppler, color Doppler, and power  Doppler as needed of all accessible portions of each vessel. Bilateral testing is considered an integral part of a complete examination. Limited examinations for reoccurring indications may be performed as noted.  Right Carotid Findings: +----------+--------+--------+--------+------------------+------------------+           PSV cm/sEDV cm/sStenosisPlaque DescriptionComments           +----------+--------+--------+--------+------------------+------------------+ CCA Prox  88      20                                                   +----------+--------+--------+--------+------------------+------------------+ CCA Distal76      16                                intimal thickening +----------+--------+--------+--------+------------------+------------------+ ICA Prox  81      26                                intimal thickening +----------+--------+--------+--------+------------------+------------------+ ICA Distal98      28                                                   +----------+--------+--------+--------+------------------+------------------+ ECA       178     0                                                    +----------+--------+--------+--------+------------------+------------------+ +----------+--------+-------+----------------+-------------------+           PSV cm/sEDV cmsDescribe        Arm Pressure (mmHG) +----------+--------+-------+----------------+-------------------+ [id number]     2      Multiphasic, WNL                    +----------+--------+-------+----------------+-------------------+ +---------+--------+--+--------+--+---------+ VertebralPSV cm/s53EDV cm/s20Antegrade +---------+--------+--+--------+--+---------+  Left Carotid Findings: +----------+-------+-------+--------+------------------------+-----------------+           PSV    EDV    StenosisPlaque Description      Comments                    cm/s   cm/s                                                      +----------+-------+-------+--------+------------------------+-----------------+  CCA Prox  101    19                                                       +----------+-------+-------+--------+------------------------+-----------------+ CCA Distal75     16                                     intimal                                                                   thickening        +----------+-------+-------+--------+------------------------+-----------------+ ICA Prox  93     30             focal, calcific and     intimal                                           smooth                  thickening        +----------+-------+-------+--------+------------------------+-----------------+ ICA Distal96     29                                                       +----------+-------+-------+--------+------------------------+-----------------+ ECA       151    1                                                        +----------+-------+-------+--------+------------------------+-----------------+ +----------+--------+--------+----------------+-------------------+           PSV cm/sEDV cm/sDescribe        Arm Pressure (mmHG) +----------+--------+--------+----------------+-------------------+ [id number]      0       Multiphasic, WNL                    +----------+--------+--------+----------------+-------------------+ +---------+--------+--+--------+-+---------+ VertebralPSV cm/s38EDV cm/s9Antegrade +---------+--------+--+--------+-+---------+   Summary: Right Carotid: The extracranial vessels were near-normal with only minimal wall                thickening or plaque. Left Carotid: The extracranial vessels were near-normal with only minimal wall               thickening or plaque. Vertebrals:  Bilateral vertebral arteries demonstrate antegrade flow. Subclavians: Normal flow hemodynamics were seen in bilateral  subclavian              arteries. *See table(s) above for measurements and observations.  Electronically signed by Delia Heady MD on 09/11/2020 at 12:49:50 PM.    Final     Cardiac Studies   Echo: 09/09/20  IMPRESSIONS    1. Left ventricular ejection fraction, by estimation, is 65 to 70%. The  left ventricle has normal function. The left ventricle has no regional  wall motion abnormalities. Left ventricular diastolic parameters are  consistent with Grade I diastolic  dysfunction (impaired relaxation).  2. Right ventricular systolic function is normal. The right ventricular  size is normal. There is normal pulmonary artery systolic pressure.  3. Left atrial size was mildly dilated.  4. The mitral valve is normal in structure. No evidence of mitral valve  regurgitation. No evidence of mitral stenosis.  5. The aortic valve is tricuspid. Aortic valve regurgitation is not  visualized. No aortic stenosis is present.  6. The inferior vena cava is normal in size with greater than 50%  respiratory variability, suggesting right atrial pressure of 3 mmHg.   Patient Profile     75 y.o. female with a hx of MS, prior tobacco use, and no known cardiac history who initially presented to [facility] with chest pain found to have trop elevation consistent with NSTEMI. 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   Assessment & Plan    1. NSTEMI: hsTn peaked at 1900. Echo with normal EF and no rWMA. Was initially treated with IV heparin and nitro. Given acute CVA would like to delay invasive procedures at least 2 weeks to allow recovery. Transitioned off IV heparin and nitro yesterday. Started on Imdur 30mg  daily without recurrent episodes of chest pain. Walked with CR this morning without issues. --  Planned for CCTA today -- pending results will plan to start ASA/plavix vs Brilinta -- continue high dose statin and BB therapy  2. Acute right PICA ischemia infarction: Noted on CT, no hemorrhagic transformation -- neurology following-> pending results of CCTA may warrant loop recorder eval -- PT/OT following  3. Pre-DM: Hgb A1c 5.9  4. HTN: tolerating BB therapy  For questions or updates, please contact [facility] Please consult [email or web address] for contact info under        Signed, , NP  09/12/2020, 10:33 AM     ATTENDING ATTESTATION  I have seen, examined and evaluated the patient this PM along with 09/14/2020, NP-C.  After reviewing all the available data and chart, we discussed the patients laboratory, study & physical findings as well as symptoms in detail. I agree with her findings, examination as well as impression recommendations as per our discussion.    She is still walking doing well without any chest pain or pressure on p.o. meds.  Now off of all drips.  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  Unfortunately CT FFR has not yet read)  Regardless, this does not appear to be a high-grade lesion.  As such, I think she is stable for discharge home on aspirin and Plavix along with other medicines including beta-blocker and Imdur.     Discussed with [facility] Attending - Dr. Laverda Page [facility] will sign off.   Medication Recommendations:  D/c on ASA & Plavix Other recommendations (labs, testing, etc):  n/a Follow up as an outpatient:  The plan will be for her to follow-up in our [place] office.     B[place]win park, M.D., M.S. Interventional Cardiologist  Pager # [phone] Phone # [phone] [address] [place], [place] [address]

## 2020-09-13 LAB — CULTURE, BLOOD (ROUTINE X 2)
Culture: NO GROWTH
Culture: NO GROWTH
Special Requests: ADEQUATE
Special Requests: ADEQUATE

## 2020-09-13 NOTE — Discharge Summary (Addendum)
Physician Discharge Summary  Caitlin Pierce ZTI:[id number] DOB: 1946/05/04 DOA: 09/08/2020  PCP: Pcp, No  Admit date: 09/08/2020 Discharge date: 09/12/2020  Time spent: 35 minutes  Recommendations for Outpatient Follow-up:  1. [facility] in [place], needs loop recorder as part of stroke work-up 2. Neurology Dr. Pearlean Brownie in 1 month-to decide regarding duration of dual antiplatelet therapy   Discharge Diagnoses:  Active Problems:   NSTEMI (non-ST elevated myocardial infarction) (HCC) Acute CVA   MS (multiple sclerosis) (HCC)   GERD (gastroesophageal reflux disease)   Essential hypertension   Chronic back pain   Prediabetes   RLS (restless legs syndrome)   Cerebral thrombosis with cerebral infarction   Discharge Condition: Stable  Diet recommendation: Carb modified, heart healthy  Filed Weights   09/08/20 1610 09/09/20 0507 09/11/20 0527  Weight: 89.4 kg 89.4 kg 87.7 kg    History of present illness:  76 year old past medical history significant for hypertension, MS, chronic back pain, RLS who presented on 3/12 to OSH with chest pain.  Patient was found to have non-STEMI and was transferred to [facility] for heart cath.  On arrival to [facility] patient was noted to have altered mental status after she received at prior hospital Phenergan and Zofran.  After further  evaluation for AMS  patient was found to have acute stroke of right PICA territory.   Hospital Course:   NSTEMI: -Patient presented to [facility] with chest pain, elevation of troponin changes and EKG consistent with non-STEMI.  Troponin 609. -2D echocardiogram noted preserved EF, no wall motion abnormality -Cardiology consulted, cath deferred in the setting of acute CVA -Coronary CTA dated today which noted a calcium score of 59 and moderate 50 to 69% mid to distal LAD lesion unfortunately FFR has not been read yet but unlikely to be a high-grade lesion, recommended against further work-up acutely in the setting  of acute CVA now, recommended discharge home on aspirin, Plavix, beta-blocker, statin and Imdur -Follow-up in the cardiology clinic [place]  Acute metabolic encephalopathy:  -Likely from polypharmacy, resolved  Acute PICA  Territory infarct; -Noted on imaging as part of encephalopathy work-up which was likely related to polypharmacy, improved  -MRI noted:Right PICA territory acute infarct. -MRA: No evidence of large vessel occlusion. -PT OT and SLP evaluations completed, outpatient PT recommended -Neurology consulted, repeat CT head was stable -LDL 56, hemoglobin A1c: 5.9. -Echo: Ejection fraction 65 percent, grade 1 diastolic dysfunction, left atrial enlargement -Carotid doppler: minimal Plaque.  -Started on Lipitor.  -Neurology recommends dual antiplatelet at discharge could be aspirin Plavix or aspirin and Brilinta-defered to cardiology, discussed with cardiology today, okay to discharge home on aspirin and Plavix, follow-up with Dr.Sethi in 4 weeks.  -Also recommended loop recorder  Multiple Sclerosis: Patient to follow-up with her outpatient neurologist  Pre- Diabetes type 2: -hb-A1c 5.9 -Diet, lifestyle modification  Chronic Back pain/RLS;  Resume requip   HTN; was not on medications.   Estimated body mass index is 33.19 kg/m as calculated from the following:   Height as of this encounter: 5\' 4"  (1.626 m).   Weight as of this encounter: 87.7 kg.  Consultants Cardiology Dr. Neurology Dr. Herbie [place]  Discharge Exam: Vitals:   09/12/20 0749 09/12/20 1203  BP: (!) 132/44 (!) 117/42  Pulse: 65 69  Resp: 16 16  Temp: 97.7 F (36.5 C) 98.1 F (36.7 C)  SpO2: 98% 98%    General: AAOx3, no distress Cardiovascular: S1-S2, regular rate rhythm Respiratory: Poor air movement bilaterally, otherwise  clear  Discharge Instructions   Discharge Instructions    Amb Referral to Cardiac Rehabilitation   Complete by: As directed    To [place]   Diagnosis:  NSTEMI   After initial evaluation and assessments completed: Virtual Based Care may be provided alone or in conjunction with Phase 2 Cardiac Rehab based on patient barriers.: Yes   Ambulatory referral to Neurology   Complete by: As directed    An appointment is requested in approximately: 4 weeks   Diet - low sodium heart healthy   Complete by: As directed    Increase activity slowly   Complete by: As directed      Allergies as of 09/12/2020   No Known Allergies     Medication List    STOP taking these medications   ibuprofen 200 MG tablet Commonly known as: ADVIL     TAKE these medications   aspirin 81 MG EC tablet Take 1 tablet (81 mg total) by mouth daily. Swallow whole.   atorvastatin 80 MG tablet Commonly known as: LIPITOR Take 1 tablet (80 mg total) by mouth daily.   clopidogrel 75 MG tablet Commonly known as: Plavix Take 1 tablet (75 mg total) by mouth daily.   isosorbide mononitrate 30 MG 24 hr tablet Commonly known as: IMDUR Take 1 tablet (30 mg total) by mouth daily.   metoprolol succinate 25 MG 24 hr tablet Commonly known as: TOPROL-XL Take 1 tablet (25 mg total) by mouth daily.   rOPINIRole 1 MG tablet Commonly known as: REQUIP Take 1-4 mg by mouth See admin instructions. Take one tablet (1 mg) by mouth twice daily as needed for restless legs, take four tablets (4 mg) at bedtime      No Known Allergies  Follow-up Information    Cardiology. Go in 2 week(s).   Why: Office will call you       Micki Riley, MD. Go in 1 month(s).   Specialties: Neurology, Radiology Contact information: [address] [place] [address] [phone]                The results of significant diagnostics from this hospitalization (including imaging, microbiology, ancillary and laboratory) are listed below for reference.    Significant Diagnostic Studies: CT HEAD WO CONTRAST  Result Date: 09/11/2020 CLINICAL DATA:  Follow-up examination  for acute stroke. EXAM: CT HEAD WITHOUT CONTRAST TECHNIQUE: Contiguous axial images were obtained from the base of the skull through the vertex without intravenous contrast. COMPARISON:  Prior MRI from 09/10/2020 FINDINGS: Brain: Continued interval evolution of right PICA territory infarct, stable in size and distribution from previous. No evidence for hemorrhagic transformation, significant regional mass effect, or other complication. No other new acute intracranial infarct or hemorrhage. 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 No midline shift or mass effect. No hydrocephalus or extra-axial fluid collection. Vascular: No hyperdense vessel. Skull: Scalp soft tissues and calvarium demonstrate no acute finding. Sinuses/Orbits: Globes and orbital soft tissues within normal limits. Paranasal sinuses and mastoid air cells remain clear. Other: None. IMPRESSION: 1. Continued interval evolution of right PICA territory infarct, stable in size and distribution from previous. No evidence for hemorrhagic transformation, significant regional mass effect, or other complication. 2. No other new acute intracranial abnormality. Electronically Signed   By: Rise Mu M.D.   On: 09/11/2020 03:14   CT HEAD WO CONTRAST  Result Date: 09/08/2020 CLINICAL DATA:  Confusion EXAM: CT HEAD WITHOUT CONTRAST TECHNIQUE: Contiguous axial images were obtained from  the base of the skull through the vertex without intravenous contrast. COMPARISON:  None. FINDINGS: Brain: No evidence of acute infarction, hemorrhage, hydrocephalus, extra-axial collection or mass lesion/mass effect. Mild-moderate low-density changes within the periventricular and subcortical white matter compatible with chronic microvascular ischemic change. Mild diffuse cerebral volume loss. Vascular: Atherosclerotic calcifications involving the large vessels of the skull base. No unexpected hyperdense vessel. Skull: Normal. Negative for fracture  or focal lesion. Sinuses/Orbits: No acute finding. Other: None. IMPRESSION: 1. No acute intracranial findings. 2. 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 Electronically Signed   By: Duanne Guess D.O.   On: 09/08/2020 18:00   MR ANGIO HEAD WO CONTRAST  Result Date: 09/10/2020 CLINICAL DATA:  75 year old female presenting with confusion. Right cerebellar infarct on brain MRI earlier today. EXAM: MRA HEAD WITHOUT CONTRAST TECHNIQUE: Angiographic images of the Circle of Willis were obtained using MRA technique without intravenous contrast. COMPARISON:  brain MRI 0047 hours today. FINDINGS: No significant intracranial mass effect is evident. No ventriculomegaly. Antegrade flow in the posterior circulation in the distal right vertebral artery appears dominant. There is no distal right vertebral stenosis. Patent vertebrobasilar junction. Right PICA origin does appear to be patent on series 20, image 55. And there is also faint visualization of the right AICA, better than that on the left. Proximal SCA is also appear symmetric and patent. Non dominant left vertebral artery with normal left PICA origin. Patent left vertebrobasilar junction. Patent basilar artery without stenosis. PCA origins are within normal limits. Posterior communicating arteries are diminutive or absent. Tortuous left P1 segment. Bilateral PCA branches are within normal limits. Antegrade flow in both ICA siphons. Normal ophthalmic artery origins. Mild bilateral siphon irregularity but no significant siphon stenosis. Patent carotid termini. Normal MCA and ACA origins. Anterior communicating artery and visible ACA branches are within normal limits. Left MCA M1 bifurcates early without stenosis. Right MCA M1 is patent to the trifurcation. The right MCA trifurcation is ectatic but patent without stenosis. No discrete aneurysm identified. Otherwise the visible bilateral MCA branches are within normal limits. IMPRESSION: 1.  Posterior circulation appears patent with no origin or proximal occlusion of right cerebellar arteries. Dominant right vertebral artery. 2. 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 Ectatic right MCA trifurcation. Otherwise negative anterior circulation. Electronically Signed   By: Odessa Fleming M.D.   On: 09/10/2020 05:07   MR BRAIN W WO CONTRAST  Result Date: 09/10/2020 CLINICAL DATA:  Delirium EXAM: MRI HEAD WITHOUT AND WITH CONTRAST TECHNIQUE: Multiplanar, multiecho pulse sequences of the brain and surrounding structures were obtained without and with intravenous contrast. CONTRAST:  9mL GADAVIST GADOBUTROL 1 MMOL/ML IV SOLN COMPARISON:  None. FINDINGS: Brain: Right PICA territory acute infarct. No acute or chronic hemorrhage. There is multifocal hyperintense T2-weighted signal within the white matter. Parenchymal volume and CSF spaces are normal. The midline structures are normal. There is no abnormal contrast enhancement. Vascular: Major flow voids are preserved. Skull and upper cervical spine: Normal calvarium and skull base. Visualized upper cervical spine and soft tissues are normal. Sinuses/Orbits:No paranasal sinus fluid levels or advanced mucosal thickening. No mastoid or middle ear effusion. Normal orbits. IMPRESSION: 1. Right PICA territory acute infarct. No hemorrhage or mass effect. 2. Findings of chronic microvascular ischemia. Electronically Signed   By: Deatra Robinson M.D.   On: 09/10/2020 02:44   CT CORONARY MORPH W/CTA COR W/SCORE W/CA W/CM &/OR WO/CM  Addendum Date: 09/12/2020   ADDENDUM REPORT: 09/12/2020 12:57 CLINICAL DATA:  75 year old female with elevated  troponin. EXAM: Cardiac/Coronary  CTA TECHNIQUE: The patient was scanned on a [employer]. FINDINGS: A 120 kV prospective scan was triggered in the descending thoracic aorta at 111 HU's. Axial non-contrast 3 mm slices were carried out through the heart. The data set was analyzed on a dedicated work station and  scored using the Agatson method. Gantry rotation speed was 250 msecs and collimation was .6 mm. 100 mg of PO Metoprolol and 0.8 mg of sl NTG were given. The 3D data set was reconstructed in 5% intervals of the 67-82 % of the R-R cycle. Diastolic phases were analyzed on a dedicated work station using MPR, MIP and VRT modes. The patient received 80 cc of contrast. Aorta: Normal size. Minimal diffuse calcifications. No dissection. Aortic Valve:  Trileaflet.  No calcifications. Coronary Arteries:  Normal coronary origin.  Right dominance. RCA is a large dominant artery that gives rise to PDA and PLA. There is minimal calcified plaque in the proximal RCA with stenosis 0-24%. Mid and distal RCA, PLA and PDA have no obvious plaque. Left main is a large artery that gives rise to LAD and LCX arteries. Distal Left main has mild calcified plaque with stenosis 0-24%. LAD is a large vessel that gives rise to two small diagonal arteries. Proximal LAD has minimal calcified plaque with stenosis 0-24%. Mid to distal LAD has mild to moderate calcified plaque with stenosis 50-69%. D1,2 are small arteries with no obvious plaque. LCX is a small non-dominant artery that gives rise to one small OM1 branch. There is no plaque. Other findings: Normal pulmonary vein drainage into the left atrium. Normal left atrial appendage without a thrombus. Normal size of the pulmonary artery. IMPRESSION: 1. Coronary calcium score of 59. This was 60 percentile for age and sex matched control. 2. Normal coronary origin with right dominance. 3. CAD-RADS 3. Moderate stenosis. Consider symptom-guided anti-ischemic pharmacotherapy as well as risk factor modification per guideline directed care. Additional analysis with CT FFR will be submitted. Electronically Signed   By: Tobias Alexander   On: 09/12/2020 12:57   Result Date: 09/12/2020 EXAM: OVER-READ INTERPRETATION  CT CHEST The following report is an over-read performed by radiologist Dr. Charlett Nose of  [facility] on 09/12/2020. This over-read does not include interpretation of cardiac or coronary anatomy or pathology. The coronary CTA interpretation by the cardiologist is attached. COMPARISON:  09/08/2020 FINDINGS: Vascular: Heart is normal size.  Aorta normal caliber. Mediastinum/Nodes: No adenopathy. Lungs/Pleura: Linear scarring and/or atelectasis in the lung bases. No effusions. Upper Abdomen: Small scattered hypodensities in the liver, stable since prior study, likely cysts. Musculoskeletal: Chest wall soft tissues are unremarkable. No acute bony abnormality. IMPRESSION: Bibasilar scarring and/or atelectasis. No acute extra cardiac abnormality. Electronically Signed: By: Charlett Nose M.D. On: 09/12/2020 12:04   ECHOCARDIOGRAM COMPLETE  Result Date: 09/09/2020    ECHOCARDIOGRAM REPORT   Patient Name:   Caitlin Pierce Date of Exam: 09/09/2020 Medical Rec #:  [id number]   Height:       64.0 in Accession #:    [id number]  Weight:       197.1 lb Date of Birth:  [birth year]   BSA:          1.944 m Patient Age:    74 years    BP:           111/56 mmHg Patient Gender: F           HR:  71 bpm. Exam Location:  Inpatient Procedure: 2D Echo, Cardiac Doppler and Color Doppler Indications:    NSTEMI  History:        Patient has no prior history of Echocardiogram examinations.                 Acute MI.  Sonographer:    Neomia Dear RDCS Referring Phys: [id number] HEATHER E [place] IMPRESSIONS  1. Left ventricular ejection fraction, by estimation, is 65 to 70%. The left ventricle has normal function. The left ventricle has no regional wall motion abnormalities. Left ventricular diastolic parameters are consistent with Grade I diastolic dysfunction (impaired relaxation).  2. Right ventricular systolic function is normal. The right ventricular size is normal. There is normal pulmonary artery systolic pressure.  3. Left atrial size was mildly dilated.  4. The mitral valve is normal in structure. No evidence  of mitral valve regurgitation. No evidence of mitral stenosis.  5. The aortic valve is tricuspid. Aortic valve regurgitation is not visualized. No aortic stenosis is present.  6. The inferior vena cava is normal in size with greater than 50% respiratory variability, suggesting right atrial pressure of 3 mmHg. FINDINGS  Left Ventricle: Left ventricular ejection fraction, by estimation, is 65 to 70%. The left ventricle has normal function. The left ventricle has no regional wall motion abnormalities. The left ventricular internal cavity size was normal in size. There is  no left ventricular hypertrophy. Left ventricular diastolic parameters are consistent with Grade I diastolic dysfunction (impaired relaxation). Indeterminate filling pressures. Right Ventricle: The right ventricular size is normal. No increase in right ventricular wall thickness. Right ventricular systolic function is normal. There is normal pulmonary artery systolic pressure. The tricuspid regurgitant velocity is 2.43 m/s, and  with an assumed right atrial pressure of 3 mmHg, the estimated right ventricular systolic pressure is 26.6 mmHg. Left Atrium: Left atrial size was mildly dilated. Right Atrium: Right atrial size was normal in size. Pericardium: Trivial pericardial effusion is present. Mitral Valve: The mitral valve is normal in structure. No evidence of mitral valve regurgitation. No evidence of mitral valve stenosis. Tricuspid Valve: The tricuspid valve is normal in structure. Tricuspid valve regurgitation is mild . No evidence of tricuspid stenosis. Aortic Valve: The aortic valve is tricuspid. Aortic valve regurgitation is not visualized. No aortic stenosis is present. Aortic valve mean gradient measures 4.0 mmHg. Aortic valve peak gradient measures 8.6 mmHg. Aortic valve area, by VTI measures 3.05 cm. Pulmonic Valve: The pulmonic valve was normal in structure. Pulmonic valve regurgitation is trivial. No evidence of pulmonic stenosis.  Aorta: The aortic root is normal in size and structure. Venous: The inferior vena cava is normal in size with greater than 50% respiratory variability, suggesting right atrial pressure of 3 mmHg. IAS/Shunts: No atrial level shunt detected by color flow Doppler.  LEFT VENTRICLE PLAX 2D LVIDd:         4.60 cm     Diastology LVIDs:         2.90 cm     LV e' medial:    6.42 cm/s LV PW:         1.10 cm     LV E/e' medial:  12.3 LV IVS:        0.95 cm     LV e' lateral:   10.70 cm/s LVOT diam:     2.30 cm     LV E/e' lateral: 7.4 LV SV:         91 LV SV Index:  47 LVOT Area:     4.15 cm  LV Volumes (MOD) LV vol d, MOD A2C: 78.3 ml LV vol d, MOD A4C: 71.5 ml LV vol s, MOD A2C: 30.1 ml LV vol s, MOD A4C: 22.2 ml LV SV MOD A2C:     48.2 ml LV SV MOD A4C:     71.5 ml LV SV MOD BP:      48.5 ml LEFT ATRIUM             Index       RIGHT ATRIUM           Index LA diam:        3.70 cm 1.90 cm/m  RA Area:     16.80 cm LA Vol (A2C):   79.6 ml 40.94 ml/m RA Volume:   44.10 ml  22.68 ml/m LA Vol (A4C):   72.0 ml 37.03 ml/m LA Biplane Vol: 80.1 ml 41.20 ml/m  AORTIC VALVE                   PULMONIC VALVE AV Area (Vmax):    3.11 cm    PV Vmax:       1.09 m/s AV Area (Vmean):   3.18 cm    PV Vmean:      73.300 cm/s AV Area (VTI):     3.05 cm    PV VTI:        0.228 m AV Vmax:           147.00 cm/s PV Peak grad:  4.8 mmHg AV Vmean:          93.700 cm/s PV Mean grad:  3.0 mmHg AV VTI:            0.297 m AV Peak Grad:      8.6 mmHg AV Mean Grad:      4.0 mmHg LVOT Vmax:         110.00 cm/s LVOT Vmean:        71.800 cm/s LVOT VTI:          0.218 m LVOT/AV VTI ratio: 0.73  AORTA Ao Root diam: 3.30 cm Ao Asc diam:  3.10 cm MITRAL VALVE               TRICUSPID VALVE MV Area (PHT): 4.71 cm    TR Peak grad:   23.6 mmHg MV Decel Time: 161 msec    TR Vmax:        243.00 cm/s MV E velocity: 78.80 cm/s MV A velocity: 95.50 cm/s  SHUNTS MV E/A ratio:  0.83        Systemic VTI:  0.22 m                            Systemic Diam: 2.30 cm  Chilton Si MD Electronically signed by Chilton Si MD Signature Date/Time: 09/09/2020/12:44:47 PM    Final    VAS US CAROTID (at [facility] and [facility] only)  Result Date: 09/11/2020 Carotid Arterial Duplex Study Indications:       CVA. Risk Factors:      Hypertension, MS, pre-diabetes, past history of smoking. Comparison Study:  No prior studies. Performing Technologist: Ernestene Mention RVT, RDMS  Examination Guidelines: A complete evaluation includes B-mode imaging, spectral Doppler, color Doppler, and power Doppler as needed of all accessible portions of each vessel. Bilateral testing is considered an integral part of a complete examination. Limited examinations for reoccurring indications may  be performed as noted.  Right Carotid Findings: +----------+--------+--------+--------+------------------+------------------+           PSV cm/sEDV cm/sStenosisPlaque DescriptionComments           +----------+--------+--------+--------+------------------+------------------+ CCA Prox  88      20                                                   +----------+--------+--------+--------+------------------+------------------+ CCA Distal76      16                                intimal thickening +----------+--------+--------+--------+------------------+------------------+ ICA Prox  81      26                                intimal thickening +----------+--------+--------+--------+------------------+------------------+ ICA Distal98      28                                                   +----------+--------+--------+--------+------------------+------------------+ ECA       178     0                                                    +----------+--------+--------+--------+------------------+------------------+ +----------+--------+-------+----------------+-------------------+           PSV cm/sEDV cmsDescribe        Arm Pressure (mmHG)  +----------+--------+-------+----------------+-------------------+ [id number]     2      Multiphasic, WNL                    +----------+--------+-------+----------------+-------------------+ +---------+--------+--+--------+--+---------+ VertebralPSV cm/s53EDV cm/s20Antegrade +---------+--------+--+--------+--+---------+  Left Carotid Findings: +----------+-------+-------+--------+------------------------+-----------------+           PSV    EDV    StenosisPlaque Description      Comments                    cm/s   cm/s                                                     +----------+-------+-------+--------+------------------------+-----------------+ CCA Prox  101    19                                                       +----------+-------+-------+--------+------------------------+-----------------+ CCA Distal75     16                                     intimal  thickening        +----------+-------+-------+--------+------------------------+-----------------+ ICA Prox  93     30             focal, calcific and     intimal                                           smooth                  thickening        +----------+-------+-------+--------+------------------------+-----------------+ ICA Distal96     29                                                       +----------+-------+-------+--------+------------------------+-----------------+ ECA       151    1                                                        +----------+-------+-------+--------+------------------------+-----------------+ +----------+--------+--------+----------------+-------------------+           PSV cm/sEDV cm/sDescribe        Arm Pressure (mmHG) +----------+--------+--------+----------------+-------------------+ [id number]      0       Multiphasic, WNL                     +----------+--------+--------+----------------+-------------------+ +---------+--------+--+--------+-+---------+ VertebralPSV cm/s38EDV cm/s9Antegrade +---------+--------+--+--------+-+---------+   Summary: Right Carotid: The extracranial vessels were near-normal with only minimal wall                thickening or plaque. Left Carotid: The extracranial vessels were near-normal with only minimal wall               thickening or plaque. Vertebrals:  Bilateral vertebral arteries demonstrate antegrade flow. Subclavians: Normal flow hemodynamics were seen in bilateral subclavian              arteries. *See table(s) above for measurements and observations.  Electronically signed by Delia Heady MD on 09/11/2020 at 12:49:50 PM.    Final     Microbiology: Recent Results (from the past 240 hour(s))  Culture, Urine     Status: Abnormal   Collection Time: 09/08/20  1:47 AM   Specimen: Urine, Random  Result Value Ref Range Status   Specimen Description URINE, RANDOM  Final   Special Requests   Final    NONE Performed at [facility], [address], [place], [place] [address]    Culture MULTIPLE SPECIES PRESENT, SUGGEST RECOLLECTION (A)  Final   Report Status 09/10/2020 FINAL  Final  Culture, blood (routine x 2)     Status: None   Collection Time: 09/08/20  9:13 PM   Specimen: BLOOD  Result Value Ref Range Status   Specimen Description BLOOD SITE NOT SPECIFIED  Final   Special Requests   Final    BOTTLES DRAWN AEROBIC AND ANAEROBIC Blood Culture adequate volume   Culture   Final    NO GROWTH 5 DAYS Performed at [facility], [address], [place], [place] [address]    Report Status 09/13/2020  FINAL  Final  Culture, blood (routine x 2)     Status: None   Collection Time: 09/08/20  9:20 PM   Specimen: BLOOD  Result Value Ref Range Status   Specimen Description BLOOD SITE NOT SPECIFIED  Final   Special Requests   Final    BOTTLES DRAWN AEROBIC ONLY Blood Culture adequate volume    Culture   Final    NO GROWTH 5 DAYS Performed at [facility], [address], [place], [place] [address]    Report Status 09/13/2020 FINAL  Final     Labs: Basic Metabolic Panel: Recent Labs  Lab 09/08/20 1706 09/09/20 0451 09/10/20 0601 09/11/20 0225 09/12/20 0334  NA 137 137 138 137 140  K 3.9 3.8 3.5 3.5 3.5  CL 111 108 105 104 108  CO2 22 24 27 27 25   GLUCOSE 138* 123* 102* 108* 109*  BUN 13 10 11 13 14   CREATININE 0.59 0.51 0.55 0.57 0.56  CALCIUM 8.2* 8.3* 8.4* 8.5* 8.5*  MG  --  1.8 1.9 1.9  --    Liver Function Tests: No results for input(s): AST, ALT, ALKPHOS, BILITOT, PROT, ALBUMIN in the last 168 hours. No results for input(s): LIPASE, AMYLASE in the last 168 hours. No results for input(s): AMMONIA in the last 168 hours. CBC: Recent Labs  Lab 09/09/20 0451 09/10/20 0601 09/11/20 0225 09/12/20 0334  WBC 8.0 6.3 6.1 5.3  HGB 10.5* 10.8* 10.4* 10.2*  HCT 30.2* 31.6* 30.4* 30.5*  MCV 97.1 97.2 95.9 97.4  PLT 214 213 207 194   Cardiac Enzymes: No results for input(s): CKTOTAL, CKMB, CKMBINDEX, TROPONINI in the last 168 hours. BNP: BNP (last 3 results) Recent Labs    09/08/20 1706  BNP 43.0    ProBNP (last 3 results) No results for input(s): PROBNP in the last 8760 hours.  CBG: No results for input(s): GLUCAP in the last 168 hours.     Signed:  Zannie Cove MD.  [facility] 09/13/2020, 3:06 PM

## 2020-09-17 ENCOUNTER — Other Ambulatory Visit: Payer: Self-pay | Admitting: *Deleted

## 2020-09-17 NOTE — Patient Instructions (Signed)
Stroke Prevention Some medical conditions and lifestyle choices can lead to a higher risk for a stroke. You can help to prevent a stroke by eating healthy foods and exercising. It also helps to not smoke and to manage any health problems you may have. How can this condition affect me? A stroke is an emergency. It should be treated right away. A stroke can lead to brain damage or threaten your life. There is a better chance of surviving and getting better after a stroke if you get medical help right away. What can increase my risk? The following medical conditions may increase your risk of a stroke:  Diseases of the heart and blood vessels (cardiovascular disease).  High blood pressure (hypertension).  Diabetes.  High cholesterol.  Sickle cell disease.  Problems with blood clotting.  Being very overweight.  Sleeping problems (obstructivesleep apnea). Other risk factors include:  Being older than age 60.  A history of blood clots, stroke, or mini-stroke (TIA).  Race, ethnic background, or a family history of stroke.  Smoking or using tobacco products.  Taking birth control pills, especially if you smoke.  Heavy alcohol and drug use.  Not being active. What actions can I take to prevent this? Manage your health conditions  High cholesterol. ? Eat a healthy diet. If this is not enough to manage your cholesterol, you may need to take medicines. ? Take medicines as told by your doctor.  High blood pressure. ? Try to keep your blood pressure below 130/80. ? If your blood pressure cannot be managed through a healthy diet and regular exercise, you may need to take medicines. ? Take medicines as told by your doctor. ? Ask your doctor if you should check your blood pressure at home. ? Have your blood pressure checked every year.  Diabetes. ? Eat a healthy diet and get regular exercise. If your blood sugar (glucose) cannot be managed through diet and exercise, you may need to  take medicines. ? Take medicines as told by your doctor.  Talk to your doctor about getting checked for sleeping problems. Signs of a problem can include: ? Snoring a lot. ? Feeling very tired.  Make sure that you manage any other conditions you have. Nutrition  Follow instructions from your doctor about what to eat or drink. You may be told to: ? Eat and drink fewer calories each day. ? Limit how much salt (sodium) you use to 1,500 milligrams (mg) each day. ? Use only healthy fats for cooking, such as olive oil, canola oil, and sunflower oil. ? Eat healthy foods. To do this:  Choose foods that are high in fiber. These include whole grains, and fresh fruits and vegetables.  Eat at least 5 servings of fruits and vegetables a day. Try to fill one-half of your plate with fruits and vegetables at each meal.  Choose low-fat (lean) proteins. These include low-fat cuts of meat, chicken without skin, fish, tofu, beans, and nuts.  Eat low-fat dairy products. ? Avoid foods that:  Are high in salt.  Have saturated fat.  Have trans fat.  Have cholesterol.  Are processed or pre-made. ? Count how many carbohydrates you eat and drink each day.   Lifestyle  If you drink alcohol: ? Limit how much you have to:  0-1 drink a day for women who are not pregnant.  0-2 drinks a day for men. ? Know how much alcohol is in your drink. In the [place] one drink equals one 12 oz bottle   of beer (355mL), one 5 oz glass of wine (148mL), or one 1 oz glass of hard liquor (44mL).  Do not smoke or use any products that have nicotine or tobacco. If you need help quitting, ask your doctor.  Avoid secondhand smoke.  Do not use drugs. Activity  Try to stay at a healthy weight.  Get at least 30 minutes of exercise on most days, such as: ? Fast walking. ? Biking. ? Swimming.   Medicines  Take over-the-counter and prescription medicines only as told by your doctor.  Avoid taking birth control pills.  Talk to your doctor about the risks of taking birth control pills if: ? You are over 35 years old. ? You smoke. ? You get very bad headaches. ? You have had a blood clot. Where to find more information  American Stroke Association: www.strokeassociation.org Get help right away if:  You or a loved one has any signs of a stroke. "BE FAST" is an easy way to remember the warning signs: ? B - Balance. Dizziness, sudden trouble walking, or loss of balance. ? E - Eyes. Trouble seeing or a change in how you see. ? F - Face. Sudden weakness or loss of feeling of the face. The face or eyelid may droop on one side. ? A - Arms. Weakness or loss of feeling in an arm. This happens all of a sudden and most often on one side of the body. ? S - Speech. Sudden trouble speaking, slurred speech, or trouble understanding what people say. ? T - Time. Time to call emergency services. Write down what time symptoms started.  You or a loved one has other signs of a stroke, such as: ? A sudden, very bad headache with no known cause. ? Feeling like you may vomit (nausea). ? Vomiting. ? A seizure. These symptoms may be an emergency. Get help right away. Call your local emergency services (911 in the [place]  Do not wait to see if the symptoms will go away.  Do not drive yourself to the hospital. Summary  You can help to prevent a stroke by eating healthy, exercising, and not smoking. It also helps to manage any health problems you have.  Do not smoke or use any products that contain nicotine or tobacco.  Get help right away if you or a loved one has any signs of a stroke. This information is not intended to replace advice given to you by your health care provider. Make sure you discuss any questions you have with your health care provider. Document Revised: 01/16/2020 Document Reviewed: 01/16/2020 Elsevier Patient Education  2021 Elsevier Inc.  

## 2020-09-17 NOTE — Patient Outreach (Signed)
[facility]) Care Management  09/17/2020  Daniyla Pfahler 10-01-1945 [id number]   RED ON EMMI ALERT - Stroke Day # 1 Date: 3/19 Red Alert Reason: Questions/problems with medications   Outreach attempt #1, successful.  Identity verified.  This care manager introduced self and stated purpose of call.  [facility] care management services explained.    Member report she is "feeling good" today but has been experiencing some side effects from her medications.  State she has had some dizziness and headache, better today.  Report she was told she would experience some of the effects from the stroke as well as the medications.  State she is taking it slowly.  She does have follow up with cardiology on 3/29.  Does not have appointment with neurology yet. She has reached out to her previously assigned neurologist (being seen for MS prior to hospitalization) and made them aware of stroke, awaiting call back for appointment.  She has also reached out to her PCP (Dr. Willa Rough at the [facility]), awaiting appointment.  Denies any urgent concerns at this time, agrees to follow up within the next 2 weeks.  Encouraged to contact this care manager with questions.   Plan: RN CM will send information regarding stroke recovery and follow up within the next 2 weeks.  Kemper Durie, [place], MSN [facility] Care Management  [facility] Manager [phone]

## 2020-09-21 ENCOUNTER — Telehealth (HOSPITAL_COMMUNITY): Payer: Self-pay

## 2020-09-21 NOTE — Telephone Encounter (Signed)
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 

## 2020-09-25 ENCOUNTER — Ambulatory Visit: Payer: Medicare Other | Admitting: Cardiology

## 2020-10-03 ENCOUNTER — Other Ambulatory Visit: Payer: Self-pay | Admitting: *Deleted

## 2020-10-03 NOTE — Patient Outreach (Signed)
[facility]) Care Management  10/03/2020  Caitlin Pierce 01/28/1946 [id number]   Outgoing call placed to member to follow up on stroke recovery.  She state she is doing well, has already been seen by PCP and neurologist and will see cardiologist tomorrow.  No further needs identified, denies ongoing concern.  Encouraged to contact this care manager if needed, will close case.  Kemper Durie, [place], MSN [facility] Care Management  [facility] Manager [phone]

## 2021-06-28 DIAGNOSIS — R0989 Other specified symptoms and signs involving the circulatory and respiratory systems: Secondary | ICD-10-CM | POA: Diagnosis not present

## 2021-06-28 DIAGNOSIS — Z20828 Contact with and (suspected) exposure to other viral communicable diseases: Secondary | ICD-10-CM | POA: Diagnosis not present

## 2021-06-28 DIAGNOSIS — J189 Pneumonia, unspecified organism: Secondary | ICD-10-CM | POA: Diagnosis not present

## 2021-06-28 DIAGNOSIS — R0600 Dyspnea, unspecified: Secondary | ICD-10-CM | POA: Diagnosis not present

## 2021-06-28 DIAGNOSIS — R051 Acute cough: Secondary | ICD-10-CM | POA: Diagnosis not present

## 2021-06-28 DIAGNOSIS — R509 Fever, unspecified: Secondary | ICD-10-CM | POA: Diagnosis not present

## 2021-06-30 DIAGNOSIS — E876 Hypokalemia: Secondary | ICD-10-CM | POA: Diagnosis not present

## 2021-06-30 DIAGNOSIS — J189 Pneumonia, unspecified organism: Secondary | ICD-10-CM | POA: Diagnosis not present

## 2021-06-30 DIAGNOSIS — I2510 Atherosclerotic heart disease of native coronary artery without angina pectoris: Secondary | ICD-10-CM | POA: Diagnosis not present

## 2021-06-30 DIAGNOSIS — G35 Multiple sclerosis: Secondary | ICD-10-CM | POA: Diagnosis not present

## 2021-06-30 DIAGNOSIS — J1282 Pneumonia due to coronavirus disease 2019: Secondary | ICD-10-CM | POA: Diagnosis not present

## 2021-06-30 DIAGNOSIS — J9601 Acute respiratory failure with hypoxia: Secondary | ICD-10-CM | POA: Diagnosis not present

## 2021-06-30 DIAGNOSIS — Z8673 Personal history of transient ischemic attack (TIA), and cerebral infarction without residual deficits: Secondary | ICD-10-CM | POA: Diagnosis not present

## 2021-06-30 DIAGNOSIS — I252 Old myocardial infarction: Secondary | ICD-10-CM | POA: Diagnosis not present

## 2021-06-30 DIAGNOSIS — U071 COVID-19: Secondary | ICD-10-CM | POA: Diagnosis not present

## 2021-06-30 DIAGNOSIS — J90 Pleural effusion, not elsewhere classified: Secondary | ICD-10-CM | POA: Diagnosis not present

## 2021-06-30 DIAGNOSIS — R0602 Shortness of breath: Secondary | ICD-10-CM | POA: Diagnosis not present

## 2021-06-30 DIAGNOSIS — Z87891 Personal history of nicotine dependence: Secondary | ICD-10-CM | POA: Diagnosis not present

## 2021-10-21 IMAGING — MR MR MRA HEAD W/O CM
1 series · 19 of 48 positions shown · non-contrast
Comparison: brain MRI 2262 hours today.

CLINICAL DATA: 74-year-old female presenting with confusion. Right
cerebellar infarct on brain MRI earlier today.

EXAM:
MRA HEAD WITHOUT CONTRAST
TECHNIQUE: Angiographic images of the Circle of Willis were obtained using MRA
technique without intravenous contrast.

[Series 20: 3d cow · axial · 0.5mm · 0.41mm/px · z∈[-83,+6]mm · 19 of 188 slices shown]
[im 1/188]
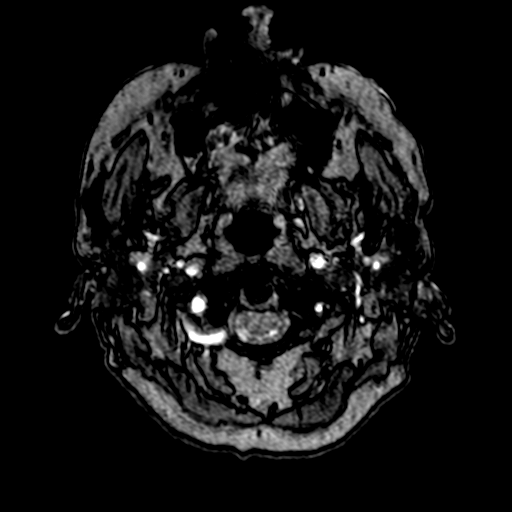
[im 4/188]
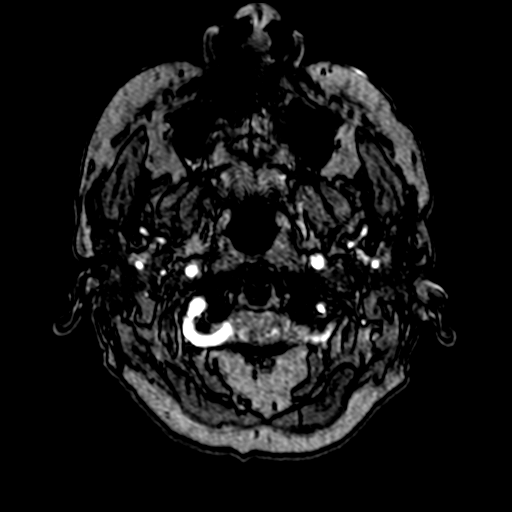
[im 8/188]
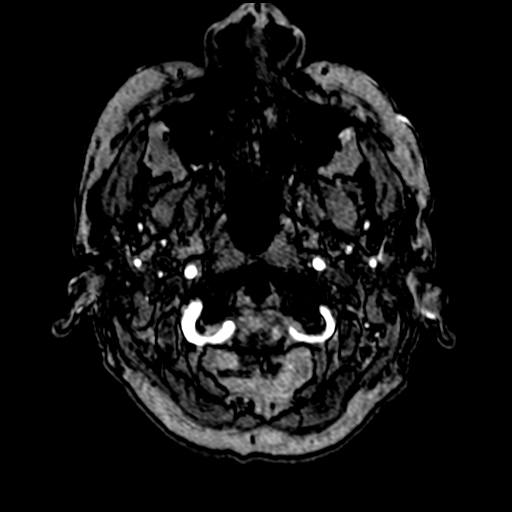
[im 12/188]
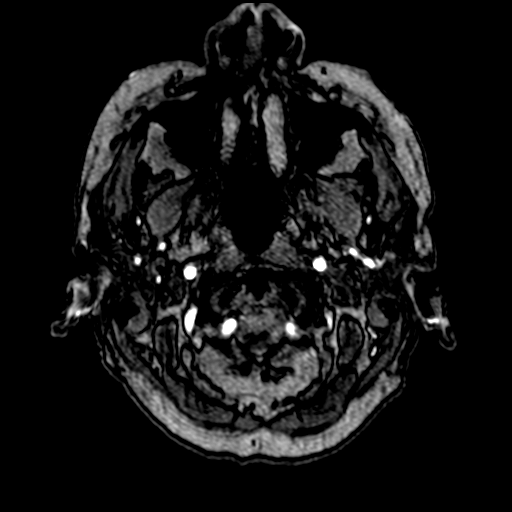
[im 16/188]
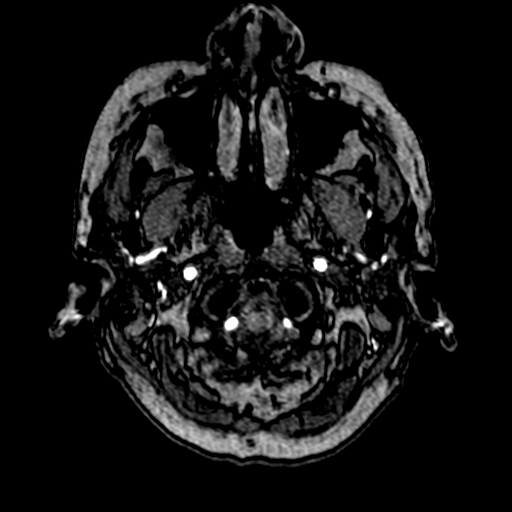
[im 20/188]
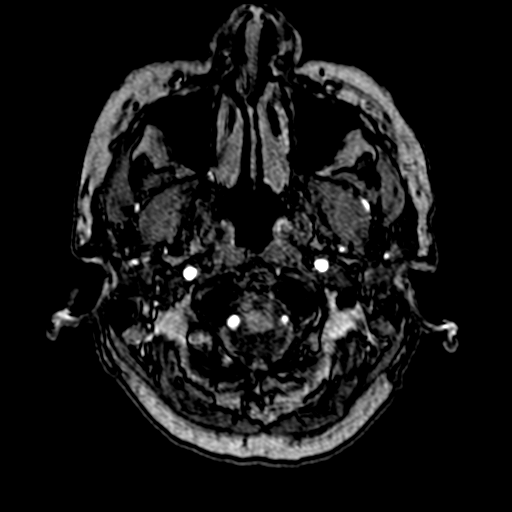
[im 24/188]
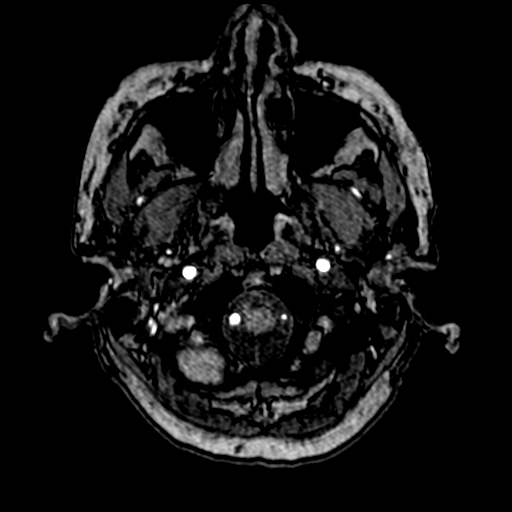
[im 28/188]
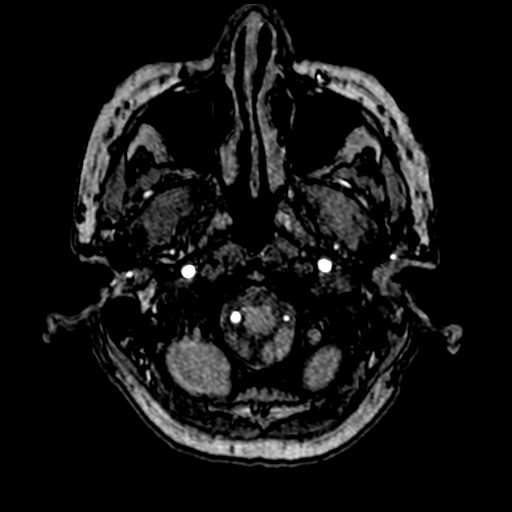
[im 32/188]
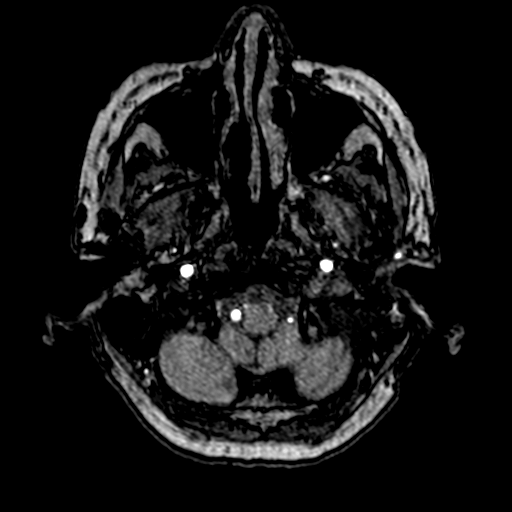
[im 36/188]
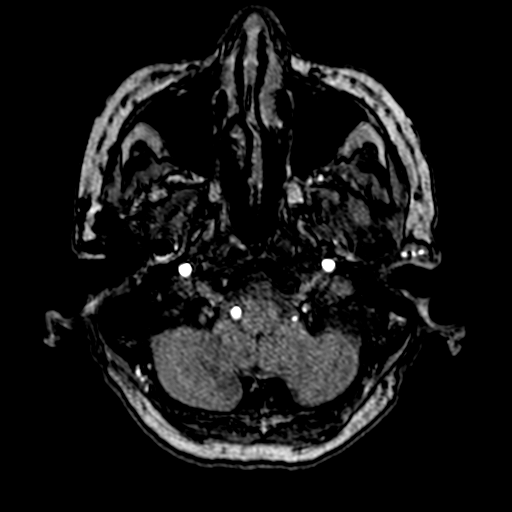
[im 40/188]
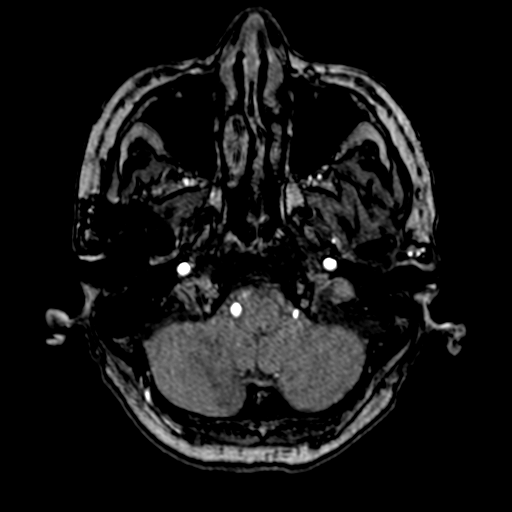
[im 60/188]
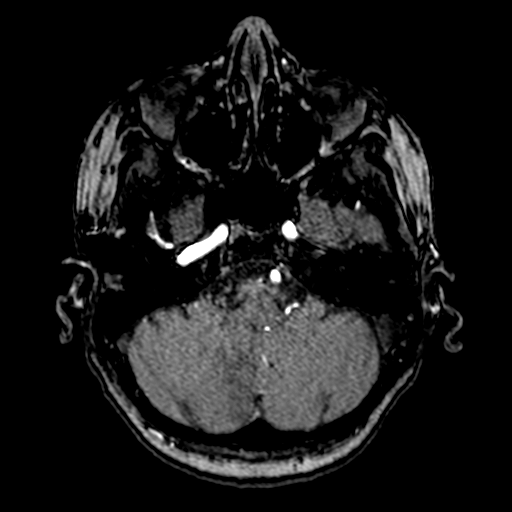
[im 84/188]
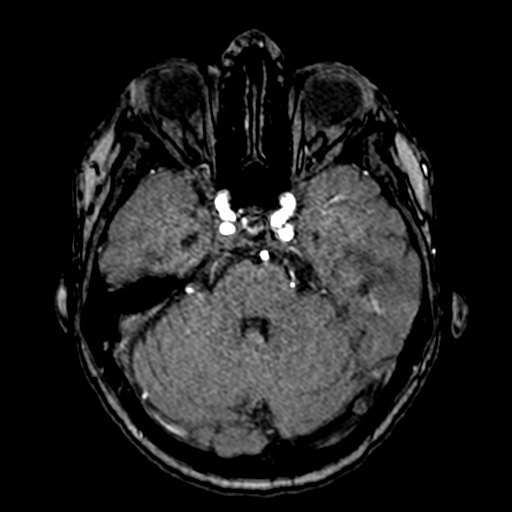
[im 96/188]
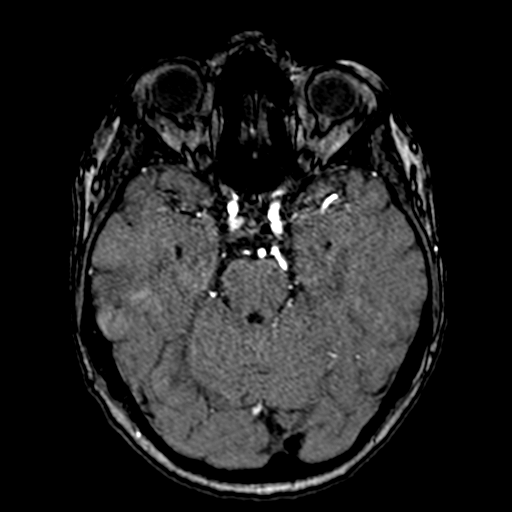
[im 108/188]
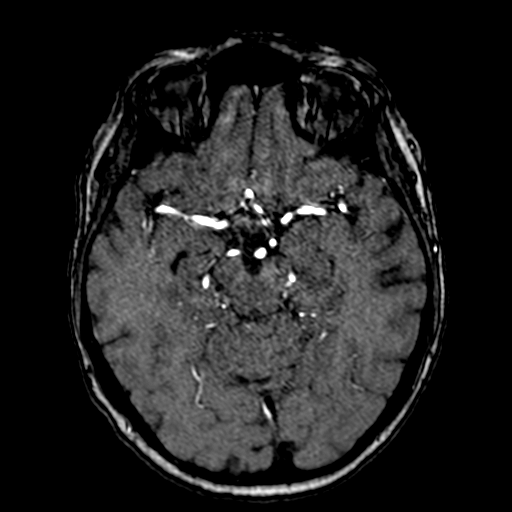
[im 132/188]
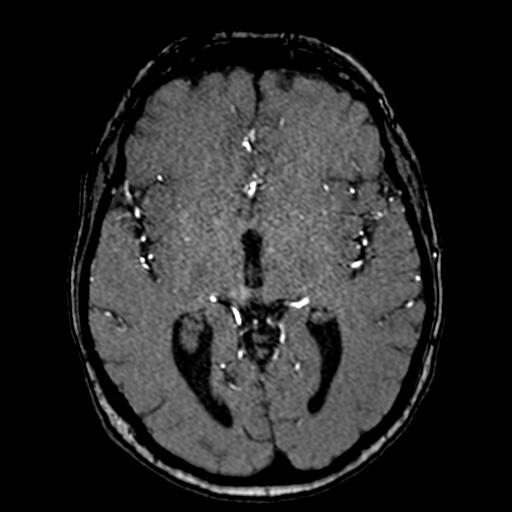
[im 156/188]
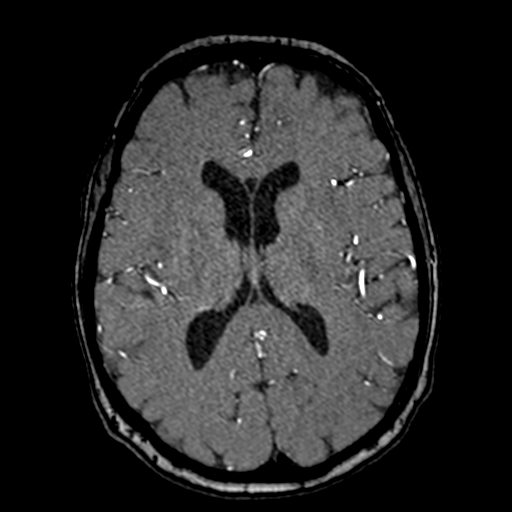
[im 160/188]
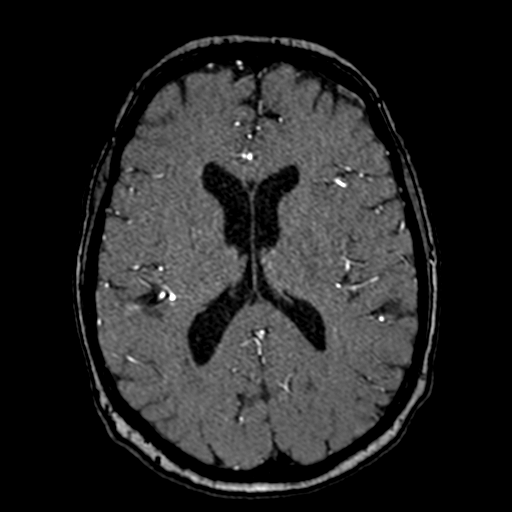
[im 180/188]
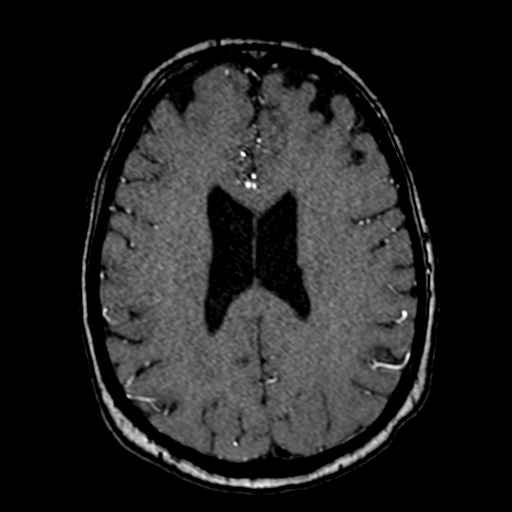

[19 of 48 positions shown; findings below may reference images not displayed]

FINDINGS: No significant intracranial mass effect is evident. No
ventriculomegaly.

Antegrade flow in the posterior circulation in the distal right
vertebral artery appears dominant. There is no distal right
vertebral stenosis. Patent vertebrobasilar junction. Right PICA
origin does appear to be patent on series 20, image 55. And there is
also faint visualization of the right AICA, better than that on the
left. Proximal SCA is also appear symmetric and patent.

Non dominant left vertebral artery with normal left PICA origin.
Patent left vertebrobasilar junction. Patent basilar artery without
stenosis. PCA origins are within normal limits. Posterior
communicating arteries are diminutive or absent. Tortuous left P1
segment. Bilateral PCA branches are within normal limits.

Antegrade flow in both ICA siphons. Normal ophthalmic artery
origins. Mild bilateral siphon irregularity but no significant
siphon stenosis. Patent carotid termini. Normal MCA and ACA origins.
Anterior communicating artery and visible ACA branches are within
normal limits. Left MCA M1 bifurcates early without stenosis. Right
MCA M1 is patent to the trifurcation. The right MCA trifurcation is
ectatic but patent without stenosis. No discrete aneurysm
identified. Otherwise the visible bilateral MCA branches are within
normal limits.
IMPRESSION: 1. Posterior circulation appears patent with no origin or proximal
occlusion of right cerebellar arteries. Dominant right vertebral
artery.

2. ICA siphon atherosclerosis without significant stenosis. Ectatic
right MCA trifurcation. Otherwise negative anterior circulation.

## 2021-12-30 DIAGNOSIS — R051 Acute cough: Secondary | ICD-10-CM | POA: Diagnosis not present

## 2021-12-30 DIAGNOSIS — R0981 Nasal congestion: Secondary | ICD-10-CM | POA: Diagnosis not present

## 2021-12-30 DIAGNOSIS — J189 Pneumonia, unspecified organism: Secondary | ICD-10-CM | POA: Diagnosis not present

## 2024-03-09 ENCOUNTER — Other Ambulatory Visit: Payer: Self-pay | Admitting: Medical Genetics

## 2024-03-17 DIAGNOSIS — R051 Acute cough: Secondary | ICD-10-CM | POA: Diagnosis not present

## 2024-03-17 DIAGNOSIS — S2341XA Sprain of ribs, initial encounter: Secondary | ICD-10-CM | POA: Diagnosis not present

## 2024-03-17 DIAGNOSIS — R0981 Nasal congestion: Secondary | ICD-10-CM | POA: Diagnosis not present

## 2024-03-17 DIAGNOSIS — R0789 Other chest pain: Secondary | ICD-10-CM | POA: Diagnosis not present

## 2024-05-13 DIAGNOSIS — G35D Multiple sclerosis, unspecified: Secondary | ICD-10-CM | POA: Diagnosis not present

## 2024-05-13 DIAGNOSIS — S0990XA Unspecified injury of head, initial encounter: Secondary | ICD-10-CM | POA: Diagnosis not present

## 2024-05-13 DIAGNOSIS — R509 Fever, unspecified: Secondary | ICD-10-CM | POA: Diagnosis not present

## 2024-05-13 DIAGNOSIS — M545 Low back pain, unspecified: Secondary | ICD-10-CM | POA: Diagnosis not present

## 2024-05-13 DIAGNOSIS — S199XXA Unspecified injury of neck, initial encounter: Secondary | ICD-10-CM | POA: Diagnosis not present

## 2024-05-13 DIAGNOSIS — I10 Essential (primary) hypertension: Secondary | ICD-10-CM | POA: Diagnosis not present

## 2024-05-13 DIAGNOSIS — S22080A Wedge compression fracture of T11-T12 vertebra, initial encounter for closed fracture: Secondary | ICD-10-CM | POA: Diagnosis not present

## 2024-05-13 DIAGNOSIS — R0781 Pleurodynia: Secondary | ICD-10-CM | POA: Diagnosis not present

## 2024-05-13 DIAGNOSIS — R0602 Shortness of breath: Secondary | ICD-10-CM | POA: Diagnosis not present

## 2024-05-13 DIAGNOSIS — R519 Headache, unspecified: Secondary | ICD-10-CM | POA: Diagnosis not present

## 2024-05-13 DIAGNOSIS — M549 Dorsalgia, unspecified: Secondary | ICD-10-CM | POA: Diagnosis not present

## 2024-05-13 DIAGNOSIS — W19XXXA Unspecified fall, initial encounter: Secondary | ICD-10-CM | POA: Diagnosis not present
